# Patient Record
Sex: Female | Born: 1945 | Race: Black or African American | Hispanic: No | Marital: Married | Smoking: Former smoker
Health system: Southern US, Community
[De-identification: ages and names within clinical notes are randomized; demographics above are authoritative.]

## PROBLEM LIST (undated history)

## (undated) DIAGNOSIS — K449 Diaphragmatic hernia without obstruction or gangrene: Secondary | ICD-10-CM

## (undated) DIAGNOSIS — K5730 Diverticulosis of large intestine without perforation or abscess without bleeding: Secondary | ICD-10-CM

## (undated) DIAGNOSIS — N189 Chronic kidney disease, unspecified: Secondary | ICD-10-CM

## (undated) DIAGNOSIS — K649 Unspecified hemorrhoids: Secondary | ICD-10-CM

## (undated) DIAGNOSIS — K2270 Barrett's esophagus without dysplasia: Secondary | ICD-10-CM

## (undated) DIAGNOSIS — E785 Hyperlipidemia, unspecified: Secondary | ICD-10-CM

## (undated) DIAGNOSIS — G4730 Sleep apnea, unspecified: Secondary | ICD-10-CM

## (undated) DIAGNOSIS — D649 Anemia, unspecified: Secondary | ICD-10-CM

## (undated) DIAGNOSIS — K635 Polyp of colon: Secondary | ICD-10-CM

## (undated) DIAGNOSIS — I10 Essential (primary) hypertension: Secondary | ICD-10-CM

## (undated) HISTORY — DX: Diaphragmatic hernia without obstruction or gangrene: K44.9

## (undated) HISTORY — DX: Hyperlipidemia, unspecified: E78.5

## (undated) HISTORY — DX: Polyp of colon: K63.5

## (undated) HISTORY — PX: OTHER SURGICAL HISTORY: SHX169

## (undated) HISTORY — DX: Diverticulosis of large intestine without perforation or abscess without bleeding: K57.30

## (undated) HISTORY — DX: Barrett's esophagus without dysplasia: K22.70

## (undated) HISTORY — PX: ABDOMINAL HYSTERECTOMY: SHX81

## (undated) HISTORY — DX: Unspecified hemorrhoids: K64.9

---

## 2013-09-10 DIAGNOSIS — H31109 Choroidal degeneration, unspecified, unspecified eye: Secondary | ICD-10-CM | POA: Diagnosis not present

## 2013-09-10 DIAGNOSIS — H5200 Hypermetropia, unspecified eye: Secondary | ICD-10-CM | POA: Diagnosis not present

## 2013-09-10 DIAGNOSIS — H269 Unspecified cataract: Secondary | ICD-10-CM | POA: Diagnosis not present

## 2013-11-12 DIAGNOSIS — N179 Acute kidney failure, unspecified: Secondary | ICD-10-CM | POA: Diagnosis present

## 2013-11-12 DIAGNOSIS — I10 Essential (primary) hypertension: Secondary | ICD-10-CM | POA: Diagnosis not present

## 2013-11-12 DIAGNOSIS — K219 Gastro-esophageal reflux disease without esophagitis: Secondary | ICD-10-CM | POA: Diagnosis present

## 2013-11-12 DIAGNOSIS — R7309 Other abnormal glucose: Secondary | ICD-10-CM | POA: Diagnosis not present

## 2013-11-12 DIAGNOSIS — D649 Anemia, unspecified: Secondary | ICD-10-CM | POA: Diagnosis present

## 2013-11-12 DIAGNOSIS — K5732 Diverticulitis of large intestine without perforation or abscess without bleeding: Secondary | ICD-10-CM | POA: Diagnosis not present

## 2013-11-12 DIAGNOSIS — K7689 Other specified diseases of liver: Secondary | ICD-10-CM | POA: Diagnosis not present

## 2013-11-12 DIAGNOSIS — Z79899 Other long term (current) drug therapy: Secondary | ICD-10-CM | POA: Diagnosis not present

## 2013-11-27 DIAGNOSIS — K5732 Diverticulitis of large intestine without perforation or abscess without bleeding: Secondary | ICD-10-CM | POA: Diagnosis not present

## 2013-11-27 DIAGNOSIS — M549 Dorsalgia, unspecified: Secondary | ICD-10-CM | POA: Diagnosis not present

## 2013-11-27 DIAGNOSIS — I10 Essential (primary) hypertension: Secondary | ICD-10-CM | POA: Diagnosis not present

## 2013-11-27 DIAGNOSIS — R937 Abnormal findings on diagnostic imaging of other parts of musculoskeletal system: Secondary | ICD-10-CM | POA: Diagnosis not present

## 2013-11-27 DIAGNOSIS — M4310 Spondylolisthesis, site unspecified: Secondary | ICD-10-CM | POA: Diagnosis not present

## 2013-11-27 DIAGNOSIS — J309 Allergic rhinitis, unspecified: Secondary | ICD-10-CM | POA: Diagnosis not present

## 2013-11-27 DIAGNOSIS — Z79899 Other long term (current) drug therapy: Secondary | ICD-10-CM | POA: Diagnosis not present

## 2013-11-27 DIAGNOSIS — K219 Gastro-esophageal reflux disease without esophagitis: Secondary | ICD-10-CM | POA: Diagnosis not present

## 2013-12-11 DIAGNOSIS — K219 Gastro-esophageal reflux disease without esophagitis: Secondary | ICD-10-CM | POA: Diagnosis not present

## 2013-12-11 DIAGNOSIS — I10 Essential (primary) hypertension: Secondary | ICD-10-CM | POA: Diagnosis not present

## 2013-12-11 DIAGNOSIS — R7989 Other specified abnormal findings of blood chemistry: Secondary | ICD-10-CM | POA: Diagnosis not present

## 2013-12-11 DIAGNOSIS — D649 Anemia, unspecified: Secondary | ICD-10-CM | POA: Diagnosis not present

## 2014-03-13 DIAGNOSIS — J309 Allergic rhinitis, unspecified: Secondary | ICD-10-CM | POA: Diagnosis not present

## 2014-03-13 DIAGNOSIS — K219 Gastro-esophageal reflux disease without esophagitis: Secondary | ICD-10-CM | POA: Diagnosis not present

## 2014-03-13 DIAGNOSIS — I10 Essential (primary) hypertension: Secondary | ICD-10-CM | POA: Diagnosis not present

## 2019-03-08 ENCOUNTER — Other Ambulatory Visit: Payer: Self-pay

## 2019-03-08 ENCOUNTER — Encounter (HOSPITAL_COMMUNITY): Payer: Self-pay | Admitting: Emergency Medicine

## 2019-03-08 ENCOUNTER — Emergency Department (HOSPITAL_COMMUNITY)
Admission: EM | Admit: 2019-03-08 | Discharge: 2019-03-09 | Disposition: A | Discharge status: Home / Self Care | Payer: Medicare Other | Attending: Emergency Medicine | Admitting: Emergency Medicine

## 2019-03-08 DIAGNOSIS — I10 Essential (primary) hypertension: Secondary | ICD-10-CM | POA: Diagnosis not present

## 2019-03-08 HISTORY — DX: Essential (primary) hypertension: I10

## 2019-03-08 LAB — BASIC METABOLIC PANEL
Anion gap: 9 (ref 5–15)
BUN: 19 mg/dL (ref 8–23)
CO2: 24 mmol/L (ref 22–32)
Calcium: 9.6 mg/dL (ref 8.9–10.3)
Chloride: 104 mmol/L (ref 98–111)
Creatinine, Ser: 1.31 mg/dL — ABNORMAL HIGH (ref 0.44–1.00)
GFR calc Af Amer: 46 mL/min — ABNORMAL LOW (ref >60)
GFR calc non Af Amer: 40 mL/min — ABNORMAL LOW (ref >60)
Glucose, Bld: 103 mg/dL — ABNORMAL HIGH (ref 70–99)
Potassium: 4.3 mmol/L (ref 3.5–5.1)
Sodium: 137 mmol/L (ref 135–145)

## 2019-03-08 LAB — CBC
HCT: 48.8 % — ABNORMAL HIGH (ref 36.0–46.0)
Hemoglobin: 14.4 g/dL (ref 12.0–15.0)
MCH: 25.5 pg — ABNORMAL LOW (ref 26.0–34.0)
MCHC: 29.5 g/dL — ABNORMAL LOW (ref 30.0–36.0)
MCV: 86.4 fL (ref 80.0–100.0)
Platelets: 251 10*3/uL (ref 150–400)
RBC: 5.65 MIL/uL — ABNORMAL HIGH (ref 3.87–5.11)
RDW: 15.7 % — ABNORMAL HIGH (ref 11.5–15.5)
WBC: 4.7 10*3/uL (ref 4.0–10.5)
nRBC: 0 % (ref 0.0–0.2)

## 2019-03-08 NOTE — ED Notes (Signed)
[facility] called to check on pt, aware still in waiting

## 2019-03-08 NOTE — ED Triage Notes (Signed)
Pt in with c/o HTN, wants BP check. States she went to doc Tuesday, was started on Amlodipine and Atenolol. In triage, BP 227/117. Denies any HA or other symptoms. Did take meds today PTA

## 2019-03-09 ENCOUNTER — Other Ambulatory Visit: Payer: Self-pay

## 2019-03-09 LAB — URINALYSIS, ROUTINE W REFLEX MICROSCOPIC
Bilirubin Urine: NEGATIVE
Glucose, UA: NEGATIVE mg/dL
Hgb urine dipstick: NEGATIVE
Ketones, ur: NEGATIVE mg/dL
Leukocytes,Ua: NEGATIVE
Nitrite: NEGATIVE
Protein, ur: 100 mg/dL — AB
Specific Gravity, Urine: 1.004 — ABNORMAL LOW (ref 1.005–1.030)
pH: 5 (ref 5.0–8.0)

## 2019-03-09 NOTE — ED Provider Notes (Signed)
[facility] EMERGENCY DEPARTMENT Provider Note   CSN: [id number] Arrival date & time: 03/08/19  1526     History   Chief Complaint Chief Complaint  Patient presents with  . Hypertension    HPI Amanda Bauer is a 73 y.o. female.     Patient with a history of HTN, thyroid disease presents to the emergency department to have her blood pressure checked. She notes having been on medication for "a long time". Her medication was changed 3 months ago to Norvasc 5 mg, and having a second medication added 4 days ago after finding her pressure elevated on PCP exam. She presents today without any symptoms wanting to have her blood pressure checked. No chest pain, new SOB, nausea, fatigue, diaphoresis.   The history is provided by the patient. No language interpreter was used.  Hypertension Pertinent negatives include no chest pain and no shortness of breath.    Past Medical History:  Diagnosis Date  . Hypertension     There are no active problems to display for this patient.   History reviewed. No pertinent surgical history.   OB History   No obstetric history on file.      Home Medications    Prior to Admission medications   Not on File    Family History No family history on file.  Social History Social History   Tobacco Use  . Smoking status: Never Smoker  . Smokeless tobacco: Never Used  Substance Use Topics  . Alcohol use: Yes    Comment: occasional  . Drug use: Not on file     Allergies   Patient has no allergy information on record.   Review of Systems Review of Systems  Constitutional: Negative for chills, diaphoresis and fever.  HENT: Negative.   Respiratory: Negative.  Negative for shortness of breath.   Cardiovascular: Negative.  Negative for chest pain.  Gastrointestinal: Negative.  Negative for nausea.  Musculoskeletal: Negative.   Skin: Negative.   Neurological: Negative.  Negative for weakness.     Physical Exam  Updated Vital Signs BP (!) 176/144 Comment: Simultaneous filing. User may not have seen previous data.  Pulse 62   Temp 97.9 F (36.6 C) (Oral)   Resp 13   Wt 104.3 kg   SpO2 93%   Physical Exam Vitals signs and nursing note reviewed.  Constitutional:      Appearance: She is well-developed.  HENT:     Head: Normocephalic.  Neck:     Musculoskeletal: Normal range of motion and neck supple.     Vascular: No carotid bruit.  Cardiovascular:     Rate and Rhythm: Normal rate and regular rhythm.  Pulmonary:     Effort: Pulmonary effort is normal.     Breath sounds: No wheezing, rhonchi or rales.  Abdominal:     General: Bowel sounds are normal.     Palpations: Abdomen is soft.     Tenderness: There is no abdominal tenderness. There is no guarding or rebound.  Musculoskeletal: Normal range of motion.     Right lower leg: No edema.     Left lower leg: No edema.  Skin:    General: Skin is warm and dry.     Findings: No rash.  Neurological:     General: No focal deficit present.     Mental Status: She is alert and oriented to person, place, and time.      ED Treatments / Results  Labs (all labs ordered are  listed, but only abnormal results are displayed) Labs Reviewed  BASIC METABOLIC PANEL - Abnormal; Notable for the following components:      Result Value   Glucose, Bld 103 (*)    Creatinine, Ser 1.31 (*)    GFR calc non Af Amer 40 (*)    GFR calc Af Amer 46 (*)    All other components within normal limits  CBC - Abnormal; Notable for the following components:   RBC 5.65 (*)    HCT 48.8 (*)    MCH 25.5 (*)    MCHC 29.5 (*)    RDW 15.7 (*)    All other components within normal limits  URINALYSIS, ROUTINE W REFLEX MICROSCOPIC    EKG EKG Interpretation  Date/Time:  Thursday March 08 2019 16:21:52 EDT Ventricular Rate:  64 PR Interval:  168 QRS Duration: 84 QT Interval:  442 QTC Calculation: 455 R Axis:   36 Text Interpretation:  Normal sinus rhythm with  sinus arrhythmia Minimal voltage criteria for LVH, may be normal variant ( Cornell product ) Borderline ECG No previous ECGs available Confirmed by Zadie Rhine ([id number]) on 03/09/2019 4:07:17 AM   Radiology No results found.  Procedures Procedures (including critical care time)  Medications Ordered in ED Medications - No data to display   Initial Impression / Assessment and Plan / ED Course  I have reviewed the triage vital signs and the nursing notes.  Pertinent labs & imaging results that were available during my care of the patient were reviewed by me and considered in my medical decision making (see chart for details).        Patient to ED for blood pressure check. She denies any concerning symptoms.   Blood pressure is elevated on multiple checks tonight. Manual pressure is 189/89.   Discussed with Dr. Bebe Shaggy who has seen the patient. Feel she can be discharged home to continue current medications, instructed to keep a log of once daily BP and follow up with PCP in one week. May need an increase to her medications but no change indicated tonight given she just started Atenolol 4 days ago and she is asymptomatic.     Final Clinical Impressions(s) / ED Diagnoses   Final diagnoses:  None   1. Hypertension   ED Discharge Orders    None       Elpidio Anis, Cordelia Poche 03/09/19 Joselyn Glassman, MD 03/09/19 [phone]

## 2019-03-09 NOTE — Discharge Instructions (Signed)
Continue taking your blood pressure medications as prescribed. Measure your blood pressure once daily and keep a journal of those measurements. Follow up with your doctor in one week for recheck and review of your journal to determine if another change to your medication is needed.   Return to the emergency department with any chest pain, significant shortness of breath, severe headache, weakness or new concern.

## 2019-03-09 NOTE — ED Notes (Signed)
Patient verbalizes understanding of discharge instructions. Opportunity for questioning and answers were provided. Armband removed by staff, pt discharged from ED.  

## 2019-05-24 DIAGNOSIS — R928 Other abnormal and inconclusive findings on diagnostic imaging of breast: Secondary | ICD-10-CM | POA: Diagnosis not present

## 2019-05-24 DIAGNOSIS — N6489 Other specified disorders of breast: Secondary | ICD-10-CM | POA: Diagnosis not present

## 2020-01-17 DIAGNOSIS — R0683 Snoring: Secondary | ICD-10-CM | POA: Diagnosis not present

## 2020-01-17 DIAGNOSIS — I10 Essential (primary) hypertension: Secondary | ICD-10-CM | POA: Diagnosis not present

## 2020-01-17 DIAGNOSIS — E785 Hyperlipidemia, unspecified: Secondary | ICD-10-CM | POA: Diagnosis not present

## 2020-01-17 DIAGNOSIS — R5383 Other fatigue: Secondary | ICD-10-CM | POA: Diagnosis not present

## 2020-01-17 DIAGNOSIS — Z79899 Other long term (current) drug therapy: Secondary | ICD-10-CM | POA: Diagnosis not present

## 2020-01-17 DIAGNOSIS — E039 Hypothyroidism, unspecified: Secondary | ICD-10-CM | POA: Diagnosis not present

## 2020-01-17 DIAGNOSIS — E1169 Type 2 diabetes mellitus with other specified complication: Secondary | ICD-10-CM | POA: Diagnosis not present

## 2020-01-17 DIAGNOSIS — R0602 Shortness of breath: Secondary | ICD-10-CM | POA: Diagnosis not present

## 2020-01-28 DIAGNOSIS — R809 Proteinuria, unspecified: Secondary | ICD-10-CM | POA: Diagnosis not present

## 2020-02-13 DIAGNOSIS — I10 Essential (primary) hypertension: Secondary | ICD-10-CM | POA: Diagnosis not present

## 2020-02-19 DIAGNOSIS — I10 Essential (primary) hypertension: Secondary | ICD-10-CM | POA: Diagnosis not present

## 2020-02-19 DIAGNOSIS — I340 Nonrheumatic mitral (valve) insufficiency: Secondary | ICD-10-CM | POA: Diagnosis not present

## 2020-02-20 ENCOUNTER — Encounter: Payer: Self-pay | Admitting: Cardiology

## 2020-02-20 DIAGNOSIS — E039 Hypothyroidism, unspecified: Secondary | ICD-10-CM

## 2020-02-20 DIAGNOSIS — I10 Essential (primary) hypertension: Secondary | ICD-10-CM | POA: Insufficient documentation

## 2020-02-20 DIAGNOSIS — K2270 Barrett's esophagus without dysplasia: Secondary | ICD-10-CM | POA: Insufficient documentation

## 2020-02-20 DIAGNOSIS — K5730 Diverticulosis of large intestine without perforation or abscess without bleeding: Secondary | ICD-10-CM

## 2020-02-20 DIAGNOSIS — K635 Polyp of colon: Secondary | ICD-10-CM | POA: Insufficient documentation

## 2020-02-20 DIAGNOSIS — K449 Diaphragmatic hernia without obstruction or gangrene: Secondary | ICD-10-CM | POA: Insufficient documentation

## 2020-02-20 DIAGNOSIS — M1990 Unspecified osteoarthritis, unspecified site: Secondary | ICD-10-CM

## 2020-02-20 DIAGNOSIS — E785 Hyperlipidemia, unspecified: Secondary | ICD-10-CM | POA: Insufficient documentation

## 2020-02-20 DIAGNOSIS — K649 Unspecified hemorrhoids: Secondary | ICD-10-CM | POA: Insufficient documentation

## 2020-02-20 HISTORY — DX: Hypothyroidism, unspecified: E03.9

## 2020-02-20 HISTORY — DX: Unspecified osteoarthritis, unspecified site: M19.90

## 2020-02-26 ENCOUNTER — Encounter: Payer: Self-pay | Admitting: Pulmonary Disease

## 2020-02-26 ENCOUNTER — Ambulatory Visit: Payer: Medicare Other | Admitting: Pulmonary Disease

## 2020-02-26 ENCOUNTER — Other Ambulatory Visit: Payer: Self-pay

## 2020-02-26 VITALS — BP 124/82 | HR 43 | Temp 97.2°F | Ht 66.0 in | Wt 228.6 lb

## 2020-02-26 DIAGNOSIS — G4733 Obstructive sleep apnea (adult) (pediatric): Secondary | ICD-10-CM

## 2020-02-26 NOTE — Progress Notes (Signed)
Amanda Bauer    [id number]    [birth year]  Primary Care Physician:Prochnau, Rayfield Citizen, MD  Referring Physician: Philemon Kingdom, MD [address] [place],  [place] [address]  Chief complaint:    Patient with a history of gasping respiration, wakes up tired  HPI:  History of snoring Wake up tired Gasping respirations at night  Usually goes to bed between 10 and 11 Might take up to 2 hours to fall asleep sometimes About 3 awakenings Final wake up time between 10 AM and 11 AM  Has a son who has obstructive sleep apnea  About 20-30 pound weight gain recently -Decreased activity levels  Admits to occasional dryness of the mouth in the morning No morning headaches No night sweats Memory is good Has no difficulty driving for long hours   Outpatient Encounter Medications as of 02/26/2020  Medication Sig  . atenolol (TENORMIN) 50 MG tablet Take 50 mg by mouth daily.  . cloNIDine (CATAPRES) 0.1 MG tablet Take 0.1 mg by mouth daily as needed.  Marland Kitchen levothyroxine (SYNTHROID) 112 MCG tablet Take 112 mcg by mouth daily before breakfast.  . valsartan (DIOVAN) 320 MG tablet    No facility-administered encounter medications on file as of 02/26/2020.    Allergies as of 02/26/2020 - Review Complete 02/26/2020  Allergen Reaction Noted  . Aspirin Other (See Comments) 02/20/2020  . Pravastatin Nausea Only 02/20/2020  . Tramadol Other (See Comments) 02/20/2020    Past Medical History:  Diagnosis Date  . Barrett esophagus   . Colon polyps   . Diverticula of colon    Left colon  . Hemorrhoids    internal   . Hiatal hernia   . Hyperlipidemia   . Hypertension   . Hypothyroidism 02/20/2020  . Osteoarthritis 02/20/2020    Past Surgical History:  Procedure Laterality Date  . complete hysterectomy      Family History  Problem Relation Age of Onset  . Hypertension Mother   . Stomach cancer Mother   . Stroke Father   . Cerebral aneurysm Other   . Cancer Other        GI  Tract    Social History   Socioeconomic History  . Marital status: Married    Spouse name: Not on file  . Number of children: Not on file  . Years of education: Not on file  . Highest education level: Not on file  Occupational History  . Not on file  Tobacco Use  . Smoking status: Never Smoker  . Smokeless tobacco: Never Used  Substance and Sexual Activity  . Alcohol use: Yes    Comment: occasional  . Drug use: Not on file  . Sexual activity: Not on file  Other Topics Concern  . Not on file  Social History Narrative  . Not on file   Social Determinants of Health   Financial Resource Strain:   . Difficulty of Paying Living Expenses: Not on file  Food Insecurity:   . Worried About Programme researcher, broadcasting/film/video in the Last Year: Not on file  . Ran Out of Food in the Last Year: Not on file  Transportation Needs:   . Lack of Transportation (Medical): Not on file  . Lack of Transportation (Non-Medical): Not on file  Physical Activity:   . Days of Exercise per Week: Not on file  . Minutes of Exercise per Session: Not on file  Stress:   . Feeling of Stress : Not on  file  Social Connections:   . Frequency of Communication with Friends and Family: Not on file  . Frequency of Social Gatherings with Friends and Family: Not on file  . Attends Religious Services: Not on file  . Active Member of Clubs or Organizations: Not on file  . Attends Banker Meetings: Not on file  . Marital Status: Not on file  Intimate Partner Violence:   . Fear of Current or Ex-Partner: Not on file  . Emotionally Abused: Not on file  . Physically Abused: Not on file  . Sexually Abused: Not on file    Review of Systems  Constitutional: Negative.   Psychiatric/Behavioral: Positive for sleep disturbance.    Vitals:   02/26/20 0940  BP: 124/82  Pulse: (!) 43  Temp: (!) 97.2 F (36.2 C)  SpO2: 98%     Physical Exam Constitutional:      Appearance: She is obese.  HENT:     Head:  Normocephalic and atraumatic.     Mouth/Throat:     Mouth: Mucous membranes are moist.     Pharynx: No oropharyngeal exudate or posterior oropharyngeal erythema.  Eyes:     General:        Right eye: No discharge.        Left eye: No discharge.  Cardiovascular:     Rate and Rhythm: Normal rate and regular rhythm.     Pulses: Normal pulses.     Heart sounds: Normal heart sounds. No murmur heard.  No friction rub.  Pulmonary:     Effort: Pulmonary effort is normal. No respiratory distress.     Breath sounds: No stridor. No wheezing or rhonchi.  Musculoskeletal:     Cervical back: No rigidity or tenderness.  Neurological:     Mental Status: She is alert.  Psychiatric:        Mood and Affect: Mood normal.    Results of the Epworth flowsheet 02/26/2020  Sitting and reading 0  Watching TV 1  Sitting, inactive in a public place (e.g. a theatre or a meeting) 0  As a passenger in a car for an hour without a break 0  Lying down to rest in the afternoon when circumstances permit 0  Sitting and talking to someone 0  Sitting quietly after a lunch without alcohol 1  In a car, while stopped for a few minutes in traffic 0  Total score 2   Assessment:  Moderate probability of significant obstructive sleep apnea  Obesity  Nonrestorative sleep  Pathophysiology of sleep disordered breathing discussed with patient Treatment options for sleep disordered breathing discussed with the patient  Diagnostic options discussed, patient does not feel she will be able to set up a home sleep study  Plan/Recommendations: We will schedule the patient for an in lab polysomnogram-split-night study will be ordered  Risks with not treating sleep disordered breathing discussed  Weight loss efforts encouraged  I will see her back in about 3 months  Encouraged to call with any significant concerns  Virl Diamond MD [facility] 02/26/2020, 10:05 AM  CC: Philemon Kingdom, MD

## 2020-02-26 NOTE — Progress Notes (Signed)
Cardiology Office Note:    Date:  02/27/2020   ID:  Amanda Bauer, DOB [birth year], MRN [id number]  PCP:  Philemon Kingdom, MD  Cardiologist:  Norman Herrlich, MD   Referring MD: Philemon Kingdom, MD  ASSESSMENT:    1. Hypertensive heart disease with heart failure (HCC)   2. Stage 3b chronic kidney disease (HCC)   3. Nonrheumatic aortic valve insufficiency   4. Palpitations    PLAN:    In order of problems listed above:  1. She has longstanding poorly controlled hypertension with endorgan damage.  Diastolic function is variable and I suspect that she had an element of diastolic heart failure with poorly controlled hypertension.  She is improved she is not fluid overloaded at this time I would not put her on a loop diuretic.  The goal here is to control her hypertension.  On when a switch her from a atenolol to carvedilol continue valsartan allow her nephrologist to make a decision about diuretic either loop or MRA with her CKD.  Another good choice for her would be a calcium channel blocker African-American patient.  I do not think she needs an ischemia evaluation. 2. To be seen by nephrology tomorrow with proteinuria she is at higher risk and I suspect she has hypertensive nephropathy her kidney function may transiently decrease but will improve and stabilize long-term with hypertension control especially with ARB. 3. Valvular heart disease is minor no murmur on exam clinically insignificant 4. At risk for arrhythmia especially atrial fibrillation we will plan a 1 week ZIO monitor if abnormal will bring her back to my office.  Next appointment I will see back in the office as needed as she will be seen by nephrology for hypertension and proteinuria and has a good relationship with her primary care physician.   Medication Adjustments/Labs and Tests Ordered: Current medicines are reviewed at length with the patient today.  Concerns regarding medicines are outlined above.  Orders Placed  This Encounter  Procedures  . LONG TERM MONITOR (3-14 DAYS)  . EKG 12-Lead   No orders of the defined types were placed in this encounter.    Chief Complaint  Patient presents with  . Shortness of Breath  . Hypertension  . Palpitations    History of Present Illness:    Amanda Bauer is a 74 y.o. female who is being seen today for the evaluation of abnormal EKG at the request of Prochnau, Rayfield Citizen, MD.  She has a long history of hypertension dating back likely 15 years pregnancy and she tells me that in general its not been well controlled.  She was recently started on ACE inhibitor.  She developed shortness of breath with activities and was seen by primary care physician 01/17/2020 her EKG showed findings of left ventricular hypertrophy and she was referred for an echocardiogram done at [facility].  Her ejection fraction was low normal she had mild left ventricular hypertrophy and she had aortic valve sclerosis and mild aortic regurgitation.  Lab work performed showed a normal CBC significant CKD with a GFR 34 cc creatinine 1.49 sodium and potassium were normal 144 and 5.0.  BNP assay was mid range at 433 not normal but not particularly elevated to heart failure range.  Her urinalysis showed 3+ protein.  Her lipid profile with this favorable with an LDL of 158 and 9 non-HDL cholesterol 190 TSH was normal A1c was normal.  She had prominent snoring and was referred to pulmonary and is scheduled for sleep study.  Her antihypertensive agents were adjusted her blood pressure remains elevated she told me at times greater than 200 systolic and even sitting and resting still has systolics in the range of 170 or greater.  Fortunately her shortness of breath and fatigue have resolved.  During that timeframe she had no edema orthopnea no chest pain but was aware of her heart beating at times it felt irregular.  The symptoms have resolved by the time the EKG was performed.  She has no known history of  murmur congenital rheumatic heart disease or atrial fibrillation.  Past Medical History:  Diagnosis Date  . Barrett esophagus   . Colon polyps   . Diverticula of colon    Left colon  . Hemorrhoids    internal   . Hiatal hernia   . Hyperlipidemia   . Hypertension   . Hypothyroidism 02/20/2020  . Osteoarthritis 02/20/2020    Past Surgical History:  Procedure Laterality Date  . complete hysterectomy      Current Medications: Current Meds  Medication Sig  . atenolol (TENORMIN) 50 MG tablet Take 50 mg by mouth daily.  . cloNIDine (CATAPRES) 0.1 MG tablet Take 0.1 mg by mouth daily as needed.  Marland Kitchen levothyroxine (SYNTHROID) 112 MCG tablet Take 112 mcg by mouth daily before breakfast.  . valsartan (DIOVAN) 320 MG tablet Take 320 mg by mouth daily.      Allergies:   Aspirin, Pravastatin, and Tramadol   Social History   Socioeconomic History  . Marital status: Married    Spouse name: Not on file  . Number of children: Not on file  . Years of education: Not on file  . Highest education level: Not on file  Occupational History  . Not on file  Tobacco Use  . Smoking status: Former Games developer  . Smokeless tobacco: Never Used  Vaping Use  . Vaping Use: Never used  Substance and Sexual Activity  . Alcohol use: Yes    Comment: occasional  . Drug use: Never  . Sexual activity: Not on file  Other Topics Concern  . Not on file  Social History Narrative  . Not on file   Social Determinants of Health   Financial Resource Strain:   . Difficulty of Paying Living Expenses: Not on file  Food Insecurity:   . Worried About Programme researcher, broadcasting/film/video in the Last Year: Not on file  . Ran Out of Food in the Last Year: Not on file  Transportation Needs:   . Lack of Transportation (Medical): Not on file  . Lack of Transportation (Non-Medical): Not on file  Physical Activity:   . Days of Exercise per Week: Not on file  . Minutes of Exercise per Session: Not on file  Stress:   . Feeling of  Stress : Not on file  Social Connections:   . Frequency of Communication with Friends and Family: Not on file  . Frequency of Social Gatherings with Friends and Family: Not on file  . Attends Religious Services: Not on file  . Active Member of Clubs or Organizations: Not on file  . Attends Banker Meetings: Not on file  . Marital Status: Not on file     Family History: The patient's family history includes Cancer in an other family member; Cerebral aneurysm in an other family member; Hypertension in her mother; Stomach cancer in her mother; Stroke in her father.  ROS:   ROS Please see the history of present illness.     All  other systems reviewed and are negative.  EKGs/Labs/Other Studies Reviewed:    The following studies were reviewed today:   EKG:  EKG is  ordered today.  The ekg ordered today is personally reviewed and demonstrates sinus bradycardia 58 bpm LVH voltage.  Recent Labs: 03/08/2019: BUN 19; Creatinine, Ser 1.31; Hemoglobin 14.4; Platelets 251; Potassium 4.3; Sodium 137  Recent Lipid Panel No results found for: CHOL, TRIG, HDL, CHOLHDL, VLDL, LDLCALC, LDLDIRECT  Physical Exam:    VS:  BP (!) 186/80   Pulse (!) 58   Ht 5\' 6"  (1.676 m)   Wt 232 lb 12.8 oz (105.6 kg)   SpO2 93%   BMI 37.57 kg/m     Wt Readings from Last 3 Encounters:  02/27/20 232 lb 12.8 oz (105.6 kg)  02/26/20 228 lb 9.6 oz (103.7 kg)  01/17/20 234 lb (106.1 kg)     GEN: Overweight BMI exceeds 35 well nourished, well developed in no acute distress HEENT: Normal NECK: No JVD; No carotid bruits LYMPHATICS: No lymphadenopathy CARDIAC: RRR, no murmurs, rubs, gallops RESPIRATORY:  Clear to auscultation without rales, wheezing or rhonchi  ABDOMEN: Soft, non-tender, non-distended MUSCULOSKELETAL:  No edema; No deformity  SKIN: Warm and dry NEUROLOGIC:  Alert and oriented x 3 PSYCHIATRIC:  Normal affect     Signed, 03/18/20, MD  02/27/2020 4:29 PM    [facility]

## 2020-02-26 NOTE — Patient Instructions (Signed)
Moderate probability of significant obstructive sleep apnea  We will schedule you for an in lab study  Update you with results  Treatment options as we discussed  Follow-up in 3 months Sleep Apnea Sleep apnea affects breathing during sleep. It causes breathing to stop for a short time or to become shallow. It can also increase the risk of:  Heart attack.  Stroke.  Being very overweight (obese).  Diabetes.  Heart failure.  Irregular heartbeat. The goal of treatment is to help you breathe normally again. What are the causes? There are three kinds of sleep apnea:  Obstructive sleep apnea. This is caused by a blocked or collapsed airway.  Central sleep apnea. This happens when the brain does not send the right signals to the muscles that control breathing.  Mixed sleep apnea. This is a combination of obstructive and central sleep apnea. The most common cause of this condition is a collapsed or blocked airway. This can happen if:  Your throat muscles are too relaxed.  Your tongue and tonsils are too large.  You are overweight.  Your airway is too small. What increases the risk?  Being overweight.  Smoking.  Having a small airway.  Being older.  Being female.  Drinking alcohol.  Taking medicines to calm yourself (sedatives or tranquilizers).  Having family members with the condition. What are the signs or symptoms?  Trouble staying asleep.  Being sleepy or tired during the day.  Getting angry a lot.  Loud snoring.  Headaches in the morning.  Not being able to focus your mind (concentrate).  Forgetting things.  Less interest in sex.  Mood swings.  Personality changes.  Feelings of sadness (depression).  Waking up a lot during the night to pee (urinate).  Dry mouth.  Sore throat. How is this diagnosed?  Your medical history.  A physical exam.  A test that is done when you are sleeping (sleep study). The test is most often done in a  sleep lab but may also be done at home. How is this treated?   Sleeping on your side.  Using a medicine to get rid of mucus in your nose (decongestant).  Avoiding the use of alcohol, medicines to help you relax, or certain pain medicines (narcotics).  Losing weight, if needed.  Changing your diet.  Not smoking.  Using a machine to open your airway while you sleep, such as: ? An oral appliance. This is a mouthpiece that shifts your lower jaw forward. ? A CPAP device. This device blows air through a mask when you breathe out (exhale). ? An EPAP device. This has valves that you put in each nostril. ? A BPAP device. This device blows air through a mask when you breathe in (inhale) and breathe out.  Having surgery if other treatments do not work. It is important to get treatment for sleep apnea. Without treatment, it can lead to:  High blood pressure.  Coronary artery disease.  In men, not being able to have an erection (impotence).  Reduced thinking ability. Follow these instructions at home: Lifestyle  Make changes that your doctor recommends.  Eat a healthy diet.  Lose weight if needed.  Avoid alcohol, medicines to help you relax, and some pain medicines.  Do not use any products that contain nicotine or tobacco, such as cigarettes, e-cigarettes, and chewing tobacco. If you need help quitting, ask your doctor. General instructions  Take over-the-counter and prescription medicines only as told by your doctor.  If you were  given a machine to use while you sleep, use it only as told by your doctor.  If you are having surgery, make sure to tell your doctor you have sleep apnea. You may need to bring your device with you.  Keep all follow-up visits as told by your doctor. This is important. Contact a doctor if:  The machine that you were given to use during sleep bothers you or does not seem to be working.  You do not get better.  You get worse. Get help right  away if:  Your chest hurts.  You have trouble breathing in enough air.  You have an uncomfortable feeling in your back, arms, or stomach.  You have trouble talking.  One side of your body feels weak.  A part of your face is hanging down. These symptoms may be an emergency. Do not wait to see if the symptoms will go away. Get medical help right away. Call your local emergency services (911 in the [place] Do not drive yourself to the hospital. Summary  This condition affects breathing during sleep.  The most common cause is a collapsed or blocked airway.  The goal of treatment is to help you breathe normally while you sleep. This information is not intended to replace advice given to you by your health care provider. Make sure you discuss any questions you have with your health care provider. Document Revised: 02/17/2018 Document Reviewed: 12/27/2017 Elsevier Patient Education  2020 [employer].

## 2020-02-27 ENCOUNTER — Encounter: Payer: Self-pay | Admitting: Cardiology

## 2020-02-27 ENCOUNTER — Ambulatory Visit (INDEPENDENT_AMBULATORY_CARE_PROVIDER_SITE_OTHER): Payer: Medicare PPO

## 2020-02-27 ENCOUNTER — Ambulatory Visit: Payer: Medicare PPO | Admitting: Cardiology

## 2020-02-27 VITALS — BP 186/80 | HR 58 | Ht 66.0 in | Wt 232.8 lb

## 2020-02-27 DIAGNOSIS — I351 Nonrheumatic aortic (valve) insufficiency: Secondary | ICD-10-CM | POA: Insufficient documentation

## 2020-02-27 DIAGNOSIS — I110 Hypertensive heart disease with heart failure: Secondary | ICD-10-CM

## 2020-02-27 DIAGNOSIS — I10 Essential (primary) hypertension: Secondary | ICD-10-CM | POA: Diagnosis not present

## 2020-02-27 DIAGNOSIS — N1832 Chronic kidney disease, stage 3b: Secondary | ICD-10-CM

## 2020-02-27 DIAGNOSIS — R002 Palpitations: Secondary | ICD-10-CM

## 2020-02-27 DIAGNOSIS — N183 Chronic kidney disease, stage 3 unspecified: Secondary | ICD-10-CM | POA: Insufficient documentation

## 2020-02-27 DIAGNOSIS — Z79899 Other long term (current) drug therapy: Secondary | ICD-10-CM | POA: Diagnosis not present

## 2020-02-27 MED ORDER — CARVEDILOL 12.5 MG PO TABS: 12.5000 mg | ORAL_TABLET | Freq: Two times a day (BID) | ORAL | 3 refills | Status: DC

## 2020-02-27 NOTE — Patient Instructions (Addendum)
Medication Instructions:  Your physician has recommended you make the following change in your medication:  STOP: Atenolol START: Carvedilol 12.5 mg take one tablet by mouth twice daily.   *If you need a refill on your cardiac medications before your next appointment, please call your pharmacy*   Lab Work: None If you have labs (blood work) drawn today and your tests are completely normal, you will receive your results only by: Marland Kitchen MyChart Message (if you have MyChart) OR . A paper copy in the mail If you have any lab test that is abnormal or we need to change your treatment, we will call you to review the results.   Testing/Procedures: A zio monitor was ordered today. It will remain on for 7 days. You will then return monitor and event diary in provided box. It takes 1-2 weeks for report to be downloaded and returned to [place]. We will call you with the results. If monitor falls off or has orange flashing light, please call [employer] for further instructions.      Follow-Up: At [facility], you and your health needs are our priority.  As part of our continuing mission to provide you with exceptional heart care, we have created designated Provider Care Teams.  These Care Teams include your primary Cardiologist (physician) and Advanced Practice Providers (APPs -  Physician Assistants and Nurse Practitioners) who all work together to provide you with the care you need, when you need it.  We recommend signing up for the patient portal called "MyChart".  Sign up information is provided on this After Visit Summary.  MyChart is used to connect with patients for Virtual Visits (Telemedicine).  Patients are able to view lab/test results, encounter notes, upcoming appointments, etc.  Non-urgent messages can be sent to your provider as well.   To learn more about what you can do with MyChart, go to [email or web address].    Your next appointment:   As needed  The format for your next appointment:   In  Person  Provider:   Norman Herrlich, MD   Other Instructions

## 2020-02-28 DIAGNOSIS — I129 Hypertensive chronic kidney disease with stage 1 through stage 4 chronic kidney disease, or unspecified chronic kidney disease: Secondary | ICD-10-CM | POA: Diagnosis not present

## 2020-02-28 DIAGNOSIS — D631 Anemia in chronic kidney disease: Secondary | ICD-10-CM | POA: Diagnosis not present

## 2020-02-28 DIAGNOSIS — R7303 Prediabetes: Secondary | ICD-10-CM | POA: Diagnosis not present

## 2020-02-28 DIAGNOSIS — N189 Chronic kidney disease, unspecified: Secondary | ICD-10-CM | POA: Diagnosis not present

## 2020-02-28 DIAGNOSIS — N1832 Chronic kidney disease, stage 3b: Secondary | ICD-10-CM | POA: Diagnosis not present

## 2020-03-03 DIAGNOSIS — E875 Hyperkalemia: Secondary | ICD-10-CM | POA: Diagnosis not present

## 2020-03-10 ENCOUNTER — Other Ambulatory Visit: Payer: Self-pay | Admitting: Nephrology

## 2020-03-10 DIAGNOSIS — N1832 Chronic kidney disease, stage 3b: Secondary | ICD-10-CM

## 2020-03-10 DIAGNOSIS — R7303 Prediabetes: Secondary | ICD-10-CM

## 2020-03-11 ENCOUNTER — Ambulatory Visit
Admission: RE | Admit: 2020-03-11 | Discharge: 2020-03-11 | Disposition: A | Discharge status: Home / Self Care | Payer: Medicare PPO | Source: Ambulatory Visit | Attending: Nephrology | Admitting: Nephrology

## 2020-03-11 DIAGNOSIS — R7303 Prediabetes: Secondary | ICD-10-CM

## 2020-03-11 DIAGNOSIS — K7689 Other specified diseases of liver: Secondary | ICD-10-CM | POA: Diagnosis not present

## 2020-03-11 DIAGNOSIS — N189 Chronic kidney disease, unspecified: Secondary | ICD-10-CM | POA: Diagnosis not present

## 2020-03-11 DIAGNOSIS — N1832 Chronic kidney disease, stage 3b: Secondary | ICD-10-CM

## 2020-03-12 DIAGNOSIS — R002 Palpitations: Secondary | ICD-10-CM | POA: Diagnosis not present

## 2020-03-12 DIAGNOSIS — Z79899 Other long term (current) drug therapy: Secondary | ICD-10-CM | POA: Diagnosis not present

## 2020-03-13 ENCOUNTER — Other Ambulatory Visit: Payer: Self-pay | Admitting: Pulmonary Disease

## 2020-03-13 ENCOUNTER — Telehealth: Payer: Self-pay

## 2020-03-13 ENCOUNTER — Telehealth: Payer: Self-pay | Admitting: Pulmonary Disease

## 2020-03-13 DIAGNOSIS — G4733 Obstructive sleep apnea (adult) (pediatric): Secondary | ICD-10-CM

## 2020-03-13 NOTE — Telephone Encounter (Signed)
-----   Message from Tomma Lightning, MD sent at 03/13/2020 11:53 AM EDT ----- Patient had stated that she may not be able to set up the study well is why I ordered an in lab  We can schedule her for a home sleep study but if we are not able to confirm that she can set it up well, unfortunately, will be a falsely negative study  We can let the patient know and if she is agreeable then we can go ahead and set up a home sleep study  Wale  ----- Message ----- From: Ave Filter, CMA Sent: 03/13/2020  11:31 AM EDT To: Tomma Lightning, MD  Pt's ins has denied the in lab sleep study they want pt to do a home study if you agree I will need an order thanks libby

## 2020-03-13 NOTE — Telephone Encounter (Signed)
That works.  Can we make sure we check on her in a week in case we don't hear back from her

## 2020-03-13 NOTE — Telephone Encounter (Addendum)
Pt wants to discuss with daughther 1 st before proceeding with home sleep study.pt has agreed to proceed with home sleep study

## 2020-03-13 NOTE — Telephone Encounter (Signed)
There has been a HST & a split night study ordered for this patient.  Which one should be scheduled? Marland Kitchen

## 2020-03-14 ENCOUNTER — Telehealth: Payer: Self-pay | Admitting: Pulmonary Disease

## 2020-03-17 ENCOUNTER — Telehealth: Payer: Self-pay

## 2020-03-17 NOTE — Telephone Encounter (Signed)
Spoke to daughter Carollee Herter pt will try HST and daughter will come with her for the instructions to help mother with study that night Tobe Sos

## 2020-03-17 NOTE — Telephone Encounter (Signed)
Spoke to [email or web address]  pt will have to try HST 1st

## 2020-03-17 NOTE — Telephone Encounter (Signed)
Tried calling patient. No answer and no voicemail set up for me to leave a message. 

## 2020-03-17 NOTE — Telephone Encounter (Signed)
Called pt no answer and no voice mail set up Amanda Bauer

## 2020-03-17 NOTE — Telephone Encounter (Signed)
-----   Message from Baldo Daub, MD sent at 03/17/2020  2:57 PM EDT ----- She has occasional atrial premature contractions but there were no episodes of atrial fibrillation or flutter overall a good report

## 2020-03-18 ENCOUNTER — Telehealth: Payer: Self-pay

## 2020-03-18 NOTE — Telephone Encounter (Signed)
-----   Message from Brian J Munley, MD sent at 03/17/2020  2:57 PM EDT ----- She has occasional atrial premature contractions but there were no episodes of atrial fibrillation or flutter overall a good report 

## 2020-03-18 NOTE — Telephone Encounter (Signed)
Tried calling patient. No answer and no voicemail set up for me to leave a message. 

## 2020-03-19 NOTE — Telephone Encounter (Signed)
I am acting in triage role today to assist with patient callbacks. Result relayed to patient. The patient verbalized understanding and gratitude.

## 2020-03-28 DIAGNOSIS — R7303 Prediabetes: Secondary | ICD-10-CM | POA: Diagnosis not present

## 2020-03-28 DIAGNOSIS — N1832 Chronic kidney disease, stage 3b: Secondary | ICD-10-CM | POA: Diagnosis not present

## 2020-03-28 DIAGNOSIS — I129 Hypertensive chronic kidney disease with stage 1 through stage 4 chronic kidney disease, or unspecified chronic kidney disease: Secondary | ICD-10-CM | POA: Diagnosis not present

## 2020-03-28 DIAGNOSIS — E785 Hyperlipidemia, unspecified: Secondary | ICD-10-CM | POA: Diagnosis not present

## 2020-04-02 ENCOUNTER — Encounter (HOSPITAL_BASED_OUTPATIENT_CLINIC_OR_DEPARTMENT_OTHER): Payer: Medicare PPO | Admitting: Internal Medicine

## 2020-04-05 ENCOUNTER — Encounter (HOSPITAL_COMMUNITY): Payer: Self-pay | Admitting: Emergency Medicine

## 2020-04-05 ENCOUNTER — Emergency Department (HOSPITAL_COMMUNITY): Payer: Medicare PPO

## 2020-04-05 ENCOUNTER — Other Ambulatory Visit: Payer: Self-pay

## 2020-04-05 ENCOUNTER — Inpatient Hospital Stay (HOSPITAL_COMMUNITY)
Admission: EM | Admit: 2020-04-05 | Discharge: 2020-04-12 | DRG: 291 | Disposition: A | Discharge status: Home / Self Care | Payer: Medicare PPO | Attending: Internal Medicine | Admitting: Internal Medicine

## 2020-04-05 DIAGNOSIS — R0602 Shortness of breath: Secondary | ICD-10-CM | POA: Diagnosis not present

## 2020-04-05 DIAGNOSIS — R339 Retention of urine, unspecified: Secondary | ICD-10-CM | POA: Diagnosis present

## 2020-04-05 DIAGNOSIS — K5730 Diverticulosis of large intestine without perforation or abscess without bleeding: Secondary | ICD-10-CM | POA: Diagnosis present

## 2020-04-05 DIAGNOSIS — E669 Obesity, unspecified: Secondary | ICD-10-CM

## 2020-04-05 DIAGNOSIS — Z800 Family history of malignant neoplasm of digestive organs: Secondary | ICD-10-CM | POA: Diagnosis not present

## 2020-04-05 DIAGNOSIS — I5033 Acute on chronic diastolic (congestive) heart failure: Secondary | ICD-10-CM | POA: Diagnosis not present

## 2020-04-05 DIAGNOSIS — I161 Hypertensive emergency: Secondary | ICD-10-CM | POA: Diagnosis present

## 2020-04-05 DIAGNOSIS — Z823 Family history of stroke: Secondary | ICD-10-CM

## 2020-04-05 DIAGNOSIS — E872 Acidosis: Secondary | ICD-10-CM | POA: Diagnosis not present

## 2020-04-05 DIAGNOSIS — Z885 Allergy status to narcotic agent status: Secondary | ICD-10-CM

## 2020-04-05 DIAGNOSIS — K219 Gastro-esophageal reflux disease without esophagitis: Secondary | ICD-10-CM | POA: Diagnosis present

## 2020-04-05 DIAGNOSIS — J9690 Respiratory failure, unspecified, unspecified whether with hypoxia or hypercapnia: Secondary | ICD-10-CM | POA: Diagnosis not present

## 2020-04-05 DIAGNOSIS — Z6837 Body mass index (BMI) 37.0-37.9, adult: Secondary | ICD-10-CM | POA: Diagnosis not present

## 2020-04-05 DIAGNOSIS — Z20822 Contact with and (suspected) exposure to covid-19: Secondary | ICD-10-CM | POA: Diagnosis not present

## 2020-04-05 DIAGNOSIS — R4182 Altered mental status, unspecified: Secondary | ICD-10-CM | POA: Diagnosis not present

## 2020-04-05 DIAGNOSIS — J810 Acute pulmonary edema: Secondary | ICD-10-CM | POA: Diagnosis not present

## 2020-04-05 DIAGNOSIS — G4730 Sleep apnea, unspecified: Secondary | ICD-10-CM | POA: Diagnosis not present

## 2020-04-05 DIAGNOSIS — Z79890 Hormone replacement therapy (postmenopausal): Secondary | ICD-10-CM

## 2020-04-05 DIAGNOSIS — K649 Unspecified hemorrhoids: Secondary | ICD-10-CM | POA: Diagnosis present

## 2020-04-05 DIAGNOSIS — J9600 Acute respiratory failure, unspecified whether with hypoxia or hypercapnia: Secondary | ICD-10-CM | POA: Diagnosis present

## 2020-04-05 DIAGNOSIS — N179 Acute kidney failure, unspecified: Secondary | ICD-10-CM

## 2020-04-05 DIAGNOSIS — J90 Pleural effusion, not elsewhere classified: Secondary | ICD-10-CM | POA: Diagnosis not present

## 2020-04-05 DIAGNOSIS — I10 Essential (primary) hypertension: Secondary | ICD-10-CM | POA: Diagnosis not present

## 2020-04-05 DIAGNOSIS — J9622 Acute and chronic respiratory failure with hypercapnia: Secondary | ICD-10-CM | POA: Diagnosis not present

## 2020-04-05 DIAGNOSIS — R0902 Hypoxemia: Secondary | ICD-10-CM | POA: Diagnosis not present

## 2020-04-05 DIAGNOSIS — R739 Hyperglycemia, unspecified: Secondary | ICD-10-CM | POA: Diagnosis present

## 2020-04-05 DIAGNOSIS — K449 Diaphragmatic hernia without obstruction or gangrene: Secondary | ICD-10-CM | POA: Diagnosis present

## 2020-04-05 DIAGNOSIS — M1990 Unspecified osteoarthritis, unspecified site: Secondary | ICD-10-CM | POA: Diagnosis present

## 2020-04-05 DIAGNOSIS — E662 Morbid (severe) obesity with alveolar hypoventilation: Secondary | ICD-10-CM | POA: Diagnosis not present

## 2020-04-05 DIAGNOSIS — I351 Nonrheumatic aortic (valve) insufficiency: Secondary | ICD-10-CM | POA: Diagnosis present

## 2020-04-05 DIAGNOSIS — E785 Hyperlipidemia, unspecified: Secondary | ICD-10-CM | POA: Diagnosis present

## 2020-04-05 DIAGNOSIS — R4781 Slurred speech: Secondary | ICD-10-CM | POA: Diagnosis not present

## 2020-04-05 DIAGNOSIS — J9811 Atelectasis: Secondary | ICD-10-CM | POA: Diagnosis present

## 2020-04-05 DIAGNOSIS — J9601 Acute respiratory failure with hypoxia: Secondary | ICD-10-CM | POA: Diagnosis not present

## 2020-04-05 DIAGNOSIS — G9341 Metabolic encephalopathy: Secondary | ICD-10-CM | POA: Diagnosis not present

## 2020-04-05 DIAGNOSIS — E875 Hyperkalemia: Secondary | ICD-10-CM | POA: Diagnosis present

## 2020-04-05 DIAGNOSIS — Z79899 Other long term (current) drug therapy: Secondary | ICD-10-CM

## 2020-04-05 DIAGNOSIS — Z8249 Family history of ischemic heart disease and other diseases of the circulatory system: Secondary | ICD-10-CM

## 2020-04-05 DIAGNOSIS — D751 Secondary polycythemia: Secondary | ICD-10-CM | POA: Diagnosis present

## 2020-04-05 DIAGNOSIS — Z888 Allergy status to other drugs, medicaments and biological substances status: Secondary | ICD-10-CM

## 2020-04-05 DIAGNOSIS — Z8719 Personal history of other diseases of the digestive system: Secondary | ICD-10-CM

## 2020-04-05 DIAGNOSIS — E039 Hypothyroidism, unspecified: Secondary | ICD-10-CM | POA: Diagnosis present

## 2020-04-05 DIAGNOSIS — N183 Chronic kidney disease, stage 3 unspecified: Secondary | ICD-10-CM | POA: Diagnosis present

## 2020-04-05 DIAGNOSIS — K2270 Barrett's esophagus without dysplasia: Secondary | ICD-10-CM | POA: Diagnosis present

## 2020-04-05 DIAGNOSIS — Z90710 Acquired absence of both cervix and uterus: Secondary | ICD-10-CM

## 2020-04-05 DIAGNOSIS — J9602 Acute respiratory failure with hypercapnia: Secondary | ICD-10-CM | POA: Diagnosis not present

## 2020-04-05 DIAGNOSIS — J9621 Acute and chronic respiratory failure with hypoxia: Secondary | ICD-10-CM | POA: Diagnosis not present

## 2020-04-05 DIAGNOSIS — N1832 Chronic kidney disease, stage 3b: Secondary | ICD-10-CM | POA: Diagnosis not present

## 2020-04-05 DIAGNOSIS — Z87891 Personal history of nicotine dependence: Secondary | ICD-10-CM

## 2020-04-05 DIAGNOSIS — I130 Hypertensive heart and chronic kidney disease with heart failure and stage 1 through stage 4 chronic kidney disease, or unspecified chronic kidney disease: Principal | ICD-10-CM | POA: Diagnosis present

## 2020-04-05 DIAGNOSIS — I517 Cardiomegaly: Secondary | ICD-10-CM | POA: Diagnosis not present

## 2020-04-05 DIAGNOSIS — Z886 Allergy status to analgesic agent status: Secondary | ICD-10-CM

## 2020-04-05 HISTORY — DX: Sleep apnea, unspecified: G47.30

## 2020-04-05 HISTORY — DX: Chronic kidney disease, unspecified: N18.9

## 2020-04-05 HISTORY — DX: Anemia, unspecified: D64.9

## 2020-04-05 LAB — I-STAT VENOUS BLOOD GAS, ED
Acid-Base Excess: 3 mmol/L — ABNORMAL HIGH (ref 0.0–2.0)
Bicarbonate: 33.4 mmol/L — ABNORMAL HIGH (ref 20.0–28.0)
Calcium, Ion: 1.23 mmol/L (ref 1.15–1.40)
HCT: 50 % — ABNORMAL HIGH (ref 36.0–46.0)
Hemoglobin: 17 g/dL — ABNORMAL HIGH (ref 12.0–15.0)
O2 Saturation: 81 %
Potassium: 5.4 mmol/L — ABNORMAL HIGH (ref 3.5–5.1)
Sodium: 141 mmol/L (ref 135–145)
TCO2: 36 mmol/L — ABNORMAL HIGH (ref 22–32)
pCO2, Ven: 74.3 mmHg (ref 44.0–60.0)
pH, Ven: 7.261 (ref 7.250–7.430)
pO2, Ven: 54 mmHg — ABNORMAL HIGH (ref 32.0–45.0)

## 2020-04-05 LAB — DIFFERENTIAL
Abs Immature Granulocytes: 0.09 10*3/uL — ABNORMAL HIGH (ref 0.00–0.07)
Basophils Absolute: 0.1 10*3/uL (ref 0.0–0.1)
Basophils Relative: 1 %
Eosinophils Absolute: 0.2 10*3/uL (ref 0.0–0.5)
Eosinophils Relative: 2 %
Immature Granulocytes: 1 %
Lymphocytes Relative: 9 %
Lymphs Abs: 0.8 10*3/uL (ref 0.7–4.0)
Monocytes Absolute: 0.5 10*3/uL (ref 0.1–1.0)
Monocytes Relative: 6 %
Neutro Abs: 6.8 10*3/uL (ref 1.7–7.7)
Neutrophils Relative %: 81 %

## 2020-04-05 LAB — URINALYSIS, ROUTINE W REFLEX MICROSCOPIC
Bilirubin Urine: NEGATIVE
Glucose, UA: NEGATIVE mg/dL
Hgb urine dipstick: NEGATIVE
Ketones, ur: NEGATIVE mg/dL
Leukocytes,Ua: NEGATIVE
Nitrite: NEGATIVE
Protein, ur: >=300 mg/dL — AB
Specific Gravity, Urine: 1.019 (ref 1.005–1.030)
pH: 5 (ref 5.0–8.0)

## 2020-04-05 LAB — I-STAT ARTERIAL BLOOD GAS, ED
Acid-Base Excess: 1 mmol/L (ref 0.0–2.0)
Acid-Base Excess: 2 mmol/L (ref 0.0–2.0)
Acid-Base Excess: 2 mmol/L (ref 0.0–2.0)
Bicarbonate: 31.8 mmol/L — ABNORMAL HIGH (ref 20.0–28.0)
Bicarbonate: 33.5 mmol/L — ABNORMAL HIGH (ref 20.0–28.0)
Bicarbonate: 31.2 mmol/L — ABNORMAL HIGH (ref 20.0–28.0)
Calcium, Ion: 1.39 mmol/L (ref 1.15–1.40)
Calcium, Ion: 1.37 mmol/L (ref 1.15–1.40)
Calcium, Ion: 1.39 mmol/L (ref 1.15–1.40)
HCT: 47 % — ABNORMAL HIGH (ref 36.0–46.0)
HCT: 46 % (ref 36.0–46.0)
HCT: 46 % (ref 36.0–46.0)
Hemoglobin: 16 g/dL — ABNORMAL HIGH (ref 12.0–15.0)
Hemoglobin: 15.6 g/dL — ABNORMAL HIGH (ref 12.0–15.0)
Hemoglobin: 15.6 g/dL — ABNORMAL HIGH (ref 12.0–15.0)
O2 Saturation: 90 %
O2 Saturation: 87 %
O2 Saturation: 96 %
Patient temperature: 97.8
Patient temperature: 98.6
Potassium: 4.7 mmol/L (ref 3.5–5.1)
Potassium: 4.9 mmol/L (ref 3.5–5.1)
Potassium: 5 mmol/L (ref 3.5–5.1)
Sodium: 141 mmol/L (ref 135–145)
Sodium: 139 mmol/L (ref 135–145)
Sodium: 140 mmol/L (ref 135–145)
TCO2: 34 mmol/L — ABNORMAL HIGH (ref 22–32)
TCO2: 36 mmol/L — ABNORMAL HIGH (ref 22–32)
TCO2: 33 mmol/L — ABNORMAL HIGH (ref 22–32)
pCO2 arterial: 78.5 mmHg (ref 32.0–48.0)
pCO2 arterial: 82.5 mmHg (ref 32.0–48.0)
pCO2 arterial: 69 mmHg (ref 32.0–48.0)
pH, Arterial: 7.215 — ABNORMAL LOW (ref 7.350–7.450)
pH, Arterial: 7.214 — ABNORMAL LOW (ref 7.350–7.450)
pH, Arterial: 7.264 — ABNORMAL LOW (ref 7.350–7.450)
pO2, Arterial: 74 mmHg — ABNORMAL LOW (ref 83.0–108.0)
pO2, Arterial: 65 mmHg — ABNORMAL LOW (ref 83.0–108.0)
pO2, Arterial: 101 mmHg (ref 83.0–108.0)

## 2020-04-05 LAB — CBC
HCT: 47.7 % — ABNORMAL HIGH (ref 36.0–46.0)
Hemoglobin: 13.7 g/dL (ref 12.0–15.0)
MCH: 25.8 pg — ABNORMAL LOW (ref 26.0–34.0)
MCHC: 28.7 g/dL — ABNORMAL LOW (ref 30.0–36.0)
MCV: 90 fL (ref 80.0–100.0)
Platelets: 261 10*3/uL (ref 150–400)
RBC: 5.3 MIL/uL — ABNORMAL HIGH (ref 3.87–5.11)
RDW: 17.1 % — ABNORMAL HIGH (ref 11.5–15.5)
WBC: 8.4 10*3/uL (ref 4.0–10.5)
nRBC: 3 % — ABNORMAL HIGH (ref 0.0–0.2)

## 2020-04-05 LAB — GLUCOSE, CAPILLARY: Glucose-Capillary: 103 mg/dL — ABNORMAL HIGH (ref 70–99)

## 2020-04-05 LAB — COMPREHENSIVE METABOLIC PANEL WITH GFR
ALT: 15 U/L (ref 0–44)
AST: 14 U/L — ABNORMAL LOW (ref 15–41)
Albumin: 3.2 g/dL — ABNORMAL LOW (ref 3.5–5.0)
Alkaline Phosphatase: 100 U/L (ref 38–126)
Anion gap: 7 (ref 5–15)
BUN: 20 mg/dL (ref 8–23)
CO2: 31 mmol/L (ref 22–32)
Calcium: 9.4 mg/dL (ref 8.9–10.3)
Chloride: 101 mmol/L (ref 98–111)
Creatinine, Ser: 1.42 mg/dL — ABNORMAL HIGH (ref 0.44–1.00)
GFR, Estimated: 38 mL/min — ABNORMAL LOW
Glucose, Bld: 160 mg/dL — ABNORMAL HIGH (ref 70–99)
Potassium: 4.5 mmol/L (ref 3.5–5.1)
Sodium: 139 mmol/L (ref 135–145)
Total Bilirubin: 0.4 mg/dL (ref 0.3–1.2)
Total Protein: 7.8 g/dL (ref 6.5–8.1)

## 2020-04-05 LAB — RAPID URINE DRUG SCREEN, HOSP PERFORMED
Amphetamines: NOT DETECTED
Barbiturates: NOT DETECTED
Benzodiazepines: NOT DETECTED
Cocaine: NOT DETECTED
Opiates: NOT DETECTED
Tetrahydrocannabinol: NOT DETECTED

## 2020-04-05 LAB — TROPONIN I (HIGH SENSITIVITY)
Troponin I (High Sensitivity): 15 ng/L (ref <18)
Troponin I (High Sensitivity): 16 ng/L (ref <18)

## 2020-04-05 LAB — RESPIRATORY PANEL BY RT PCR (FLU A&B, COVID)
Influenza A by PCR: NEGATIVE
Influenza B by PCR: NEGATIVE
SARS Coronavirus 2 by RT PCR: NEGATIVE

## 2020-04-05 LAB — BRAIN NATRIURETIC PEPTIDE: B Natriuretic Peptide: 872.6 pg/mL — ABNORMAL HIGH (ref 0.0–100.0)

## 2020-04-05 LAB — AMMONIA: Ammonia: 35 umol/L (ref 9–35)

## 2020-04-05 LAB — PROTIME-INR
INR: 1 (ref 0.8–1.2)
Prothrombin Time: 12.4 s (ref 11.4–15.2)

## 2020-04-05 LAB — CBG MONITORING, ED
Glucose-Capillary: 137 mg/dL — ABNORMAL HIGH (ref 70–99)
Glucose-Capillary: 110 mg/dL — ABNORMAL HIGH (ref 70–99)

## 2020-04-05 LAB — APTT: aPTT: 30 s (ref 24–36)

## 2020-04-05 LAB — ETHANOL: Alcohol, Ethyl (B): <10 mg/dL (ref <10)

## 2020-04-05 LAB — TSH: TSH: 2.558 u[IU]/mL (ref 0.350–4.500)

## 2020-04-05 MED ORDER — ALBUTEROL SULFATE HFA 108 (90 BASE) MCG/ACT IN AERS
4.0000 | INHALATION_SPRAY | Freq: Once | RESPIRATORY_TRACT | Status: DC
  Filled 2020-04-05: qty 6.7

## 2020-04-05 MED ORDER — IRBESARTAN 75 MG PO TABS
37.5000 mg | ORAL_TABLET | Freq: Once | ORAL | Status: DC
  Filled 2020-04-05: qty 0.5

## 2020-04-05 MED ORDER — ATORVASTATIN CALCIUM 10 MG PO TABS
10.0000 mg | ORAL_TABLET | Freq: Every day | ORAL | Status: DC
  Administered 2020-04-06 – 2020-04-12 (×7): 10 mg via ORAL
  Filled 2020-04-05 (×9): qty 1

## 2020-04-05 MED ORDER — FUROSEMIDE 10 MG/ML IJ SOLN
40.0000 mg | Freq: Three times a day (TID) | INTRAMUSCULAR | Status: AC
  Administered 2020-04-05 – 2020-04-06 (×2): 40 mg via INTRAVENOUS
  Filled 2020-04-05 (×2): qty 4

## 2020-04-05 MED ORDER — CARVEDILOL 12.5 MG PO TABS
12.5000 mg | ORAL_TABLET | Freq: Two times a day (BID) | ORAL | Status: DC
  Administered 2020-04-05 – 2020-04-12 (×12): 12.5 mg via ORAL
  Filled 2020-04-05 (×16): qty 1

## 2020-04-05 MED ORDER — CHLORHEXIDINE GLUCONATE CLOTH 2 % EX PADS
6.0000 | MEDICATED_PAD | Freq: Every day | CUTANEOUS | Status: DC
  Administered 2020-04-05 – 2020-04-11 (×6): 6 via TOPICAL

## 2020-04-05 MED ORDER — POLYETHYLENE GLYCOL 3350 17 G PO PACK: 17.0000 g | PACK | Freq: Every day | ORAL | Status: DC | PRN

## 2020-04-05 MED ORDER — DOCUSATE SODIUM 100 MG PO CAPS: 100.0000 mg | ORAL_CAPSULE | Freq: Two times a day (BID) | ORAL | Status: DC | PRN

## 2020-04-05 MED ORDER — IOHEXOL 350 MG/ML SOLN
60.0000 mL | Freq: Once | INTRAVENOUS | Status: AC | PRN
  Administered 2020-04-05: 60 mL via INTRAVENOUS

## 2020-04-05 MED ORDER — SODIUM CHLORIDE 0.9% FLUSH: 3.0000 mL | Freq: Once | INTRAVENOUS | Status: DC

## 2020-04-05 MED ORDER — CLONIDINE HCL 0.1 MG PO TABS
0.1000 mg | ORAL_TABLET | Freq: Once | ORAL | Status: AC
  Administered 2020-04-05: 0.1 mg via ORAL
  Filled 2020-04-05: qty 1

## 2020-04-05 MED ORDER — IRBESARTAN 300 MG PO TABS
300.0000 mg | ORAL_TABLET | Freq: Once | ORAL | Status: DC
  Filled 2020-04-05: qty 1

## 2020-04-05 MED ORDER — CHLORHEXIDINE GLUCONATE 0.12% ORAL RINSE (MEDLINE KIT)
15.0000 mL | Freq: Two times a day (BID) | OROMUCOSAL | Status: DC
  Administered 2020-04-06 – 2020-04-12 (×10): 15 mL via OROMUCOSAL

## 2020-04-05 MED ORDER — FUROSEMIDE 10 MG/ML IJ SOLN
40.0000 mg | Freq: Once | INTRAMUSCULAR | Status: AC
  Administered 2020-04-05: 40 mg via INTRAVENOUS
  Filled 2020-04-05: qty 4

## 2020-04-05 MED ORDER — INSULIN ASPART 100 UNIT/ML ~~LOC~~ SOLN
2.0000 [IU] | SUBCUTANEOUS | Status: DC
  Administered 2020-04-06: 4 [IU] via SUBCUTANEOUS
  Administered 2020-04-07 – 2020-04-08 (×2): 2 [IU] via SUBCUTANEOUS

## 2020-04-05 MED ORDER — LEVOTHYROXINE SODIUM 112 MCG PO TABS
112.0000 ug | ORAL_TABLET | Freq: Every day | ORAL | Status: DC
  Administered 2020-04-07 – 2020-04-12 (×6): 112 ug via ORAL
  Filled 2020-04-05 (×7): qty 1

## 2020-04-05 MED ORDER — HYDRALAZINE HCL 20 MG/ML IJ SOLN
10.0000 mg | INTRAMUSCULAR | Status: DC | PRN
  Administered 2020-04-05: 10 mg via INTRAVENOUS
  Filled 2020-04-05: qty 1

## 2020-04-05 MED ORDER — ORAL CARE MOUTH RINSE
15.0000 mL | OROMUCOSAL | Status: DC
  Administered 2020-04-06 – 2020-04-08 (×11): 15 mL via OROMUCOSAL

## 2020-04-05 MED ORDER — HEPARIN SODIUM (PORCINE) 5000 UNIT/ML IJ SOLN
5000.0000 [IU] | Freq: Three times a day (TID) | INTRAMUSCULAR | Status: DC
  Administered 2020-04-05 – 2020-04-12 (×18): 5000 [IU] via SUBCUTANEOUS
  Filled 2020-04-05 (×18): qty 1

## 2020-04-05 NOTE — ED Provider Notes (Signed)
Care received from [facility]. Please see her note for full HPI.  In short, 74 year old female woke up this morning feeling short of breath, speech seemed "off" per daughter, questionably slurred.  EMS was called, found the patient to be hypoxic in the 80s, arrived to the ED on 6 L nasal cannula.  Patient has no history of COPD, not a current smoker but with remote, no home O2 oxygen requirements. Per chart review pt was evaluated for sleep apnea however there was no followup.  No fevers, chest pain.  Prior history of hypertension.  No other sick contacts at home.  No noticeable swelling to the lower extremities.  On arrival she was alert and oriented x3, however poor historian.  Sats were in the upper 90s at 6 L.  Work-up initiated by the previous provider, followed up on the labs.  Her CBC was without leukocytosis, normal hemoglobin.  CMP without any electrolyte abnormalities, creatinine is at baseline.  PT/INR and APTT normal.  Negative ethanol.  Negative ammonia.  BN P elevated at 872.  Chest x-ray consistent with pulmonary edema and cardiomegaly.  CT of the head without abnormalities.  Previous provider thought that one of her extremities may have been larger than the other and thus a CTA was ordered.  On my reevaluation, patient becoming increasingly confused, will not answer questions.  Daughter at bedside states she is getting more confused as well.  I-STAT blood gas with evidence of hypoxic hypercarbic respiratory failure.  CTA is negative for PE.  Patient was given home BP meds, IV Lasix, placed on BiPAP. Suspect symptoms are secondary to new onset heart failure in the setting of untreated sleep apnea.  Repeat ABG with worsening CO2. CC consulted. They will admit for further evaluation and treatment.   Physical Exam  BP (!) 163/83   Pulse 68   Temp 97.8 F (36.6 C) (Oral)   Resp (!) 22   Ht 5\' 6"  (1.676 m)   Wt 106.6 kg   SpO2 95%   BMI 37.93 kg/m   Physical Exam Vitals and  nursing note reviewed.  Constitutional:      General: She is not in acute distress.    Appearance: She is well-developed. She is obese. She is ill-appearing.  HENT:     Head: Normocephalic and atraumatic.  Eyes:     Conjunctiva/sclera: Conjunctivae normal.  Cardiovascular:     Rate and Rhythm: Normal rate and regular rhythm.     Heart sounds: No murmur heard.   Pulmonary:     Effort: Tachypnea and accessory muscle usage present. No respiratory distress.     Breath sounds: Examination of the right-upper field reveals wheezing. Examination of the left-upper field reveals wheezing. Wheezing present.  Chest:     Chest wall: No tenderness.  Abdominal:     Palpations: Abdomen is soft.     Tenderness: There is no abdominal tenderness.  Musculoskeletal:        General: Normal range of motion.     Cervical back: Neck supple.     Right lower leg: No tenderness. Edema present.     Left lower leg: No tenderness. Edema present.  Skin:    General: Skin is warm and dry.  Neurological:     Mental Status: She is alert.     Comments: Will not follow commands, combative  Psychiatric:        Mood and Affect: Mood normal.        Behavior: Behavior normal.  ED Course/Procedures    Results for orders placed or performed during the hospital encounter of 04/05/20  Respiratory Panel by RT PCR (Flu A&B, Covid) - Nasopharyngeal Swab   Specimen: Nasopharyngeal Swab; Nasopharyngeal(NP) swabs in vial transport medium  Result Value Ref Range   SARS Coronavirus 2 by RT PCR NEGATIVE NEGATIVE   Influenza A by PCR NEGATIVE NEGATIVE   Influenza B by PCR NEGATIVE NEGATIVE  Protime-INR  Result Value Ref Range   Prothrombin Time 12.4 11.4 - 15.2 seconds   INR 1.0 0.8 - 1.2  APTT  Result Value Ref Range   aPTT 30 24 - 36 seconds  CBC  Result Value Ref Range   WBC 8.4 4.0 - 10.5 K/uL   RBC 5.30 (H) 3.87 - 5.11 MIL/uL   Hemoglobin 13.7 12.0 - 15.0 g/dL   HCT 96.7 (H) 36 - 46 %   MCV 90.0 80.0 -  100.0 fL   MCH 25.8 (L) 26.0 - 34.0 pg   MCHC 28.7 (L) 30.0 - 36.0 g/dL   RDW 89.3 (H) 81.0 - 17.5 %   Platelets 261 150 - 400 K/uL   nRBC 3.0 (H) 0.0 - 0.2 %  Differential  Result Value Ref Range   Neutrophils Relative % 81 %   Neutro Abs 6.8 1.7 - 7.7 K/uL   Lymphocytes Relative 9 %   Lymphs Abs 0.8 0.7 - 4.0 K/uL   Monocytes Relative 6 %   Monocytes Absolute 0.5 0.1 - 1.0 K/uL   Eosinophils Relative 2 %   Eosinophils Absolute 0.2 0.0 - 0.5 K/uL   Basophils Relative 1 %   Basophils Absolute 0.1 0.0 - 0.1 K/uL   Immature Granulocytes 1 %   Abs Immature Granulocytes 0.09 (H) 0.00 - 0.07 K/uL  Comprehensive metabolic panel  Result Value Ref Range   Sodium 139 135 - 145 mmol/L   Potassium 4.5 3.5 - 5.1 mmol/L   Chloride 101 98 - 111 mmol/L   CO2 31 22 - 32 mmol/L   Glucose, Bld 160 (H) 70 - 99 mg/dL   BUN 20 8 - 23 mg/dL   Creatinine, Ser 1.02 (H) 0.44 - 1.00 mg/dL   Calcium 9.4 8.9 - 58.5 mg/dL   Total Protein 7.8 6.5 - 8.1 g/dL   Albumin 3.2 (L) 3.5 - 5.0 g/dL   AST 14 (L) 15 - 41 U/L   ALT 15 0 - 44 U/L   Alkaline Phosphatase 100 38 - 126 U/L   Total Bilirubin 0.4 0.3 - 1.2 mg/dL   GFR, Estimated 38 (L) >60 mL/min   Anion gap 7 5 - 15  Ethanol  Result Value Ref Range   Alcohol, Ethyl (B) <10 <10 mg/dL  Urine rapid drug screen (hosp performed)  Result Value Ref Range   Opiates NONE DETECTED NONE DETECTED   Cocaine NONE DETECTED NONE DETECTED   Benzodiazepines NONE DETECTED NONE DETECTED   Amphetamines NONE DETECTED NONE DETECTED   Tetrahydrocannabinol NONE DETECTED NONE DETECTED   Barbiturates NONE DETECTED NONE DETECTED  Urinalysis, Routine w reflex microscopic  Result Value Ref Range   Color, Urine YELLOW YELLOW   APPearance CLEAR CLEAR   Specific Gravity, Urine 1.019 1.005 - 1.030   pH 5.0 5.0 - 8.0   Glucose, UA NEGATIVE NEGATIVE mg/dL   Hgb urine dipstick NEGATIVE NEGATIVE   Bilirubin Urine NEGATIVE NEGATIVE   Ketones, ur NEGATIVE NEGATIVE mg/dL    Protein, ur >=277 (A) NEGATIVE mg/dL   Nitrite NEGATIVE NEGATIVE   Leukocytes,Ua  NEGATIVE NEGATIVE   RBC / HPF 0-5 0 - 5 RBC/hpf   WBC, UA 0-5 0 - 5 WBC/hpf   Bacteria, UA RARE (A) NONE SEEN   Squamous Epithelial / LPF 0-5 0 - 5  Brain natriuretic peptide  Result Value Ref Range   B Natriuretic Peptide 872.6 (H) 0.0 - 100.0 pg/mL  Ammonia  Result Value Ref Range   Ammonia 35 9 - 35 umol/L  CBG monitoring, ED  Result Value Ref Range   Glucose-Capillary 137 (H) 70 - 99 mg/dL  I-Stat arterial blood gas, [facility] ED)  Result Value Ref Range   pH, Arterial 7.215 (L) 7.35 - 7.45   pCO2 arterial 78.5 (HH) 32 - 48 mmHg   pO2, Arterial 74 (L) 83 - 108 mmHg   Bicarbonate 31.8 (H) 20.0 - 28.0 mmol/L   TCO2 34 (H) 22 - 32 mmol/L   O2 Saturation 90.0 %   Acid-Base Excess 1.0 0.0 - 2.0 mmol/L   Sodium 141 135 - 145 mmol/L   Potassium 4.7 3.5 - 5.1 mmol/L   Calcium, Ion 1.39 1.15 - 1.40 mmol/L   HCT 47.0 (H) 36 - 46 %   Hemoglobin 16.0 (H) 12.0 - 15.0 g/dL   Collection site Radial    Drawn by Operator    Sample type ARTERIAL    Comment NOTIFIED PHYSICIAN   I-Stat venous blood gas, ED  Result Value Ref Range   pH, Ven 7.261 7.25 - 7.43   pCO2, Ven 74.3 (HH) 44 - 60 mmHg   pO2, Ven 54.0 (H) 32 - 45 mmHg   Bicarbonate 33.4 (H) 20.0 - 28.0 mmol/L   TCO2 36 (H) 22 - 32 mmol/L   O2 Saturation 81.0 %   Acid-Base Excess 3.0 (H) 0.0 - 2.0 mmol/L   Sodium 141 135 - 145 mmol/L   Potassium 5.4 (H) 3.5 - 5.1 mmol/L   Calcium, Ion 1.23 1.15 - 1.40 mmol/L   HCT 50.0 (H) 36 - 46 %   Hemoglobin 17.0 (H) 12.0 - 15.0 g/dL   Sample type VENOUS    Comment NOTIFIED PHYSICIAN   I-Stat arterial blood gas, [facility] ED)  Result Value Ref Range   pH, Arterial 7.214 (L) 7.35 - 7.45   pCO2 arterial 82.5 (HH) 32 - 48 mmHg   pO2, Arterial 65 (L) 83 - 108 mmHg   Bicarbonate 33.5 (H) 20.0 - 28.0 mmol/L   TCO2 36 (H) 22 - 32 mmol/L   O2 Saturation 87.0 %   Acid-Base Excess 2.0 0.0 - 2.0 mmol/L   Sodium 139 135 - 145  mmol/L   Potassium 4.9 3.5 - 5.1 mmol/L   Calcium, Ion 1.37 1.15 - 1.40 mmol/L   HCT 46.0 36 - 46 %   Hemoglobin 15.6 (H) 12.0 - 15.0 g/dL   Patient temperature 78.4 F    Collection site Radial    Drawn by RT    Sample type ARTERIAL    Comment NOTIFIED PHYSICIAN   I-Stat arterial blood gas, ED  Result Value Ref Range   pH, Arterial 7.264 (L) 7.35 - 7.45   pCO2 arterial 69.0 (HH) 32 - 48 mmHg   pO2, Arterial 101 83 - 108 mmHg   Bicarbonate 31.2 (H) 20.0 - 28.0 mmol/L   TCO2 33 (H) 22 - 32 mmol/L   O2 Saturation 96.0 %   Acid-Base Excess 2.0 0.0 - 2.0 mmol/L   Sodium 140 135 - 145 mmol/L   Potassium 5.0 3.5 - 5.1 mmol/L  Calcium, Ion 1.39 1.15 - 1.40 mmol/L   HCT 46.0 36 - 46 %   Hemoglobin 15.6 (H) 12.0 - 15.0 g/dL   Patient temperature 16.198.6 F    Collection site Radial    Drawn by Operator    Sample type ARTERIAL    Comment NOTIFIED PHYSICIAN   CBG monitoring, ED  Result Value Ref Range   Glucose-Capillary 110 (H) 70 - 99 mg/dL  Troponin I (High Sensitivity)  Result Value Ref Range   Troponin I (High Sensitivity) 15 <18 ng/L  Troponin I (High Sensitivity)  Result Value Ref Range   Troponin I (High Sensitivity) 16 <18 ng/L   DG Chest 2 View  Result Date: 04/05/2020 CLINICAL DATA:  Shortness of breath. EXAM: CHEST - 2 VIEW COMPARISON:  May 29, 2012 FINDINGS: Cardiomegaly. The hila and mediastinum are unremarkable. Bilateral interstitial opacities, most prominent the bases. No pneumothorax. No other acute abnormalities. IMPRESSION: Findings are most consistent with cardiomegaly and pulmonary edema. Electronically Signed   By: Gerome Samavid  Williams III M.D   On: 04/05/2020 14:58   CT HEAD WO CONTRAST  Result Date: 04/05/2020 CLINICAL DATA:  Mental status change. EXAM: CT HEAD WITHOUT CONTRAST TECHNIQUE: Contiguous axial images were obtained from the base of the skull through the vertex without intravenous contrast. COMPARISON:  None. FINDINGS: Brain: No subdural, epidural, or  subarachnoid hemorrhage. Ventricles and sulci are unremarkable. No acute cortical ischemia or infarct. No mass effect or midline shift. Basal gangliar calcifications are identified, of no acute significance. Cerebellum, brainstem, and basal cisterns are normal. Vascular: Calcified atherosclerosis in the intracranial carotids. Skull: Normal. Negative for fracture or focal lesion. Sinuses/Orbits: No acute finding. Other: None. IMPRESSION: No acute intracranial abnormalities. Electronically Signed   By: Gerome Samavid  Williams III M.D   On: 04/05/2020 14:57   CT Angio Chest PE W and/or Wo Contrast  Result Date: 04/05/2020 CLINICAL DATA:  Hypoxia. Slurred speech. Possible pulmonary embolism. EXAM: CT ANGIOGRAPHY CHEST WITH CONTRAST TECHNIQUE: Multidetector CT imaging of the chest was performed using the standard protocol during bolus administration of intravenous contrast. Multiplanar CT image reconstructions and MIPs were obtained to evaluate the vascular anatomy. CONTRAST:  60mL OMNIPAQUE IOHEXOL 350 MG/ML SOLN COMPARISON:  CT abdomen 11/12/2013 FINDINGS: Cardiovascular: Mild cardiomegaly thoracic aorta is normal in caliber. Pulmonary arterial system demonstrates no evidence of pulmonary emboli. Remaining vascular structures are unremarkable. Mediastinum/Nodes: 1.5 cm precarinal lymph no other significant mediastinal or hilar adenopathy. Remaining mediastinal structures are unremarkable. Lungs/Pleura: Lungs are somewhat hypoinflated demonstrate a small right pleural effusion. Mild hazy patchy density over the lung bases likely atelectasis and less likely infection. Subtle hazy perihilar density which may be due to edema. Airways are within normal. Upper Abdomen: A couple liver cysts are present unchanged. Musculoskeletal: Degenerative changes of the spine. Review of the MIP images confirms the above findings. IMPRESSION: 1. No evidence of pulmonary embolism. 2. Small right pleural effusion. Minimal patchy hazy density  over the lung bases likely atelectasis and less likely infection. Subtle hazy perihilar density which may be due to edema. 3. Mild cardiomegaly. 4. Two liver cysts unchanged. Electronically Signed   By: Elberta Fortisaniel  Boyle M.D.   On: 04/05/2020 17:27   US RENAL  Result Date: 03/13/2020 CLINICAL DATA:  Chronic kidney disease EXAM: RENAL / URINARY TRACT ULTRASOUND COMPLETE COMPARISON:  CT 11/12/2013 FINDINGS: Right Kidney: Renal measurements: 10.4 x 4.3 x 4.5 cm = volume: 105 mL. Echogenicity within normal limits. No mass or hydronephrosis visualized. Left Kidney: Renal measurements: 10.3 x  4.5 x 4.9 cm = volume: 120 mL. Echogenicity within normal limits. No mass or hydronephrosis visualized. Bladder: Appears normal for degree of bladder distention. Other: Incidental note made of septated liver cyst measuring 5 cm, appears to correspond to hepatic cyst on previous CT but is slightly increased in size. IMPRESSION: Normal ultrasound appearance of the kidneys. Electronically Signed   By: Jasmine Pang M.D.   On: 03/13/2020 23:08   LONG TERM MONITOR (3-14 DAYS)  Result Date: 03/17/2020 A ZIO monitor was performed for 7 days beginning 02/27/2020 to evaluate palpitation. The cardiac rhythm throughout was sinus with average, minimum of maximum heart rates of 58, 39 and 99 bpm. There were no pauses of 3 seconds or greater and no episodes of second or third-degree AV block. There were no triggered or diary events. Ventricular ectopy was rare with isolated PVCs and couplets. Occasional supraventricular ectopy was seen with APCs and no episodes of atrial fibrillation or flutter. There were 15 brief episodes of atrial tachycardia the longest lasting 17 complexes at 136 bpm, atrial tachycardia. Conclusion brief runs of atrial premature contractions with occasional supraventricular ectopy.  These episodes were asymptomatic.   Procedures  MDM         Leone Brand 04/05/20 2111    Linwood Dibbles, MD 04/06/20  [phone]

## 2020-04-05 NOTE — Progress Notes (Signed)
Critical ABG values given to Dr. Lynelle Doctor.

## 2020-04-05 NOTE — ED Provider Notes (Signed)
[facility] EMERGENCY DEPARTMENT Provider Note   CSN: [id number] Arrival date & time: 04/05/20  1311     History Chief Complaint  Patient presents with  . Shortness of Breath  . Slurred Speech    Amanda Bauer is a 74 y.o. female who presents to the ed with hypoxia and slurred speech.  History is given by the patient.  She arrives by [facility] for onset of shortness of breath.  She states that she woke with shortness of breath.  She is found to have oxygen saturations in the 80s on room air and is currently satting at 94% on 6 L.  She is alert and oriented to person place time and event.  She does not feel as though her speech is altered.  She denies feeling short of breath.  She denies chest pain.  She is never had anything like this before.  She is not on oxygen at home.  She has a history of hypertension.  HPI     Past Medical History:  Diagnosis Date  . Barrett esophagus   . Colon polyps   . Diverticula of colon    Left colon  . Hemorrhoids    internal   . Hiatal hernia   . Hyperlipidemia   . Hypertension   . Hypothyroidism 02/20/2020  . Osteoarthritis 02/20/2020    Patient Active Problem List   Diagnosis Date Noted  . CKD (chronic kidney disease) stage 3, GFR 30-59 ml/min (HCC) 02/27/2020  . Aortic regurgitation 02/27/2020  . Hypertension 02/20/2020  . Hypothyroidism 02/20/2020  . Osteoarthritis 02/20/2020  . Hyperlipidemia   . Hiatal hernia   . Hemorrhoids   . Diverticula of colon   . Colon polyps   . Barrett esophagus     Past Surgical History:  Procedure Laterality Date  . complete hysterectomy       OB History   No obstetric history on file.     Family History  Problem Relation Age of Onset  . Hypertension Mother   . Stomach cancer Mother   . Stroke Father   . Cerebral aneurysm Other   . Cancer Other        GI Tract    Social History   Tobacco Use  . Smoking status: Former Games developer  . Smokeless tobacco: Never  Used  Vaping Use  . Vaping Use: Never used  Substance Use Topics  . Alcohol use: Yes    Comment: occasional  . Drug use: Never    Home Medications Prior to Admission medications   Medication Sig Start Date End Date Taking? Authorizing Provider  carvedilol (COREG) 12.5 MG tablet Take 1 tablet (12.5 mg total) by mouth 2 (two) times daily. 02/27/20 05/27/20  Baldo Daub, MD  cloNIDine (CATAPRES) 0.1 MG tablet Take 0.1 mg by mouth daily as needed.    [provider]  levothyroxine (SYNTHROID) 112 MCG tablet Take 112 mcg by mouth daily before breakfast.    [provider]  valsartan (DIOVAN) 320 MG tablet Take 320 mg by mouth daily.  02/13/20   [provider]    Allergies    Aspirin, Pravastatin, and Tramadol  Review of Systems   Review of Systems Ten systems reviewed and are negative for acute change, except as noted in the HPI.   Physical Exam Updated Vital Signs BP (!) 172/109 (BP Location: Right Arm)   Pulse 60   Temp 97.8 F (36.6 C) (Oral)   Resp (!) 22  Ht 5\' 6"  (1.676 m)   Wt 106.6 kg   SpO2 97%   BMI 37.93 kg/m   Physical Exam Vitals and nursing note reviewed.  Constitutional:      General: She is not in acute distress.    Appearance: She is well-developed. She is not diaphoretic.  HENT:     Head: Normocephalic and atraumatic.  Eyes:     General: No scleral icterus.    Conjunctiva/sclera: Conjunctivae normal.  Cardiovascular:     Rate and Rhythm: Normal rate and regular rhythm.     Heart sounds: Normal heart sounds. No murmur heard.  No friction rub. No gallop.   Pulmonary:     Effort: Pulmonary effort is normal. No respiratory distress.     Breath sounds: Normal breath sounds.  Abdominal:     General: Bowel sounds are normal. There is no distension.     Palpations: Abdomen is soft. There is no mass.     Tenderness: There is no abdominal tenderness. There is no guarding.  Musculoskeletal:     Cervical back: Normal range of  motion.  Skin:    General: Skin is warm and dry.  Neurological:     Mental Status: She is alert and oriented to person, place, and time.     Comments: Speech is DYSARTHRIC but goal oriented, follows commands Major Cranial nerves without deficit, no facial droop Normal strength in upper and lower extremities bilaterally including dorsiflexion and plantar flexion, strong and equal grip strength Sensation normal to light and sharp touch Moves extremities without ataxia, coordination intact Normal finger to nose and rapid alternating movements Neg romberg, no pronator drift Gait deferred Normal heel-shin and balance   Psychiatric:        Behavior: Behavior normal.     ED Results / Procedures / Treatments   Labs (all labs ordered are listed, but only abnormal results are displayed) Labs Reviewed  CBC - Abnormal; Notable for the following components:      Result Value   RBC 5.30 (*)    HCT 47.7 (*)    MCH 25.8 (*)    MCHC 28.7 (*)    RDW 17.1 (*)    All other components within normal limits  CBG MONITORING, ED - Abnormal; Notable for the following components:   Glucose-Capillary 137 (*)    All other components within normal limits  PROTIME-INR  APTT  DIFFERENTIAL  COMPREHENSIVE METABOLIC PANEL    EKG None  Radiology No results found.  Procedures .Critical Care Performed by: , PA-C Authorized by: Arthor Captain, PA-C   Critical care provider statement:    Critical care time (minutes):  45   Critical care time was exclusive of:  Separately billable procedures and treating other patients   Critical care was necessary to treat or prevent imminent or life-threatening deterioration of the following conditions:  Respiratory failure   Critical care was time spent personally by me on the following activities:  Discussions with consultants, evaluation of patient's response to treatment, examination of patient, ordering and performing treatments and  interventions, ordering and review of laboratory studies, ordering and review of radiographic studies, pulse oximetry, re-evaluation of patient's condition, obtaining history from patient or surrogate and review of old charts   (including critical care time)  Medications Ordered in ED Medications  sodium chloride flush (NS) 0.9 % injection 3 mL (has no administration in time range)    ED Course  I have reviewed the triage vital signs and the nursing  notes.  Pertinent labs & imaging results that were available during my care of the patient were reviewed by me and considered in my medical decision making (see chart for details).    MDM Rules/Calculators/A&P                          Patient here with dysarthria, she seems somewhat confused at times.  When I asked the patient what brought her in today she said that she did not know and that she was not feeling short of breath although she appears very winded and is on 6 L via cannula.  She is here with dysarthric speech and hypoxia which is new.  I ordered and reviewed labs which include CBC which shows no significant abnormalities.  Her CMP shows elevated glucose of 160, some renal insufficiency with a creatinine of 1.42.  Her BNP is elevated 872.6 with no history of the same for comparison.  Troponin within normal limits.  Patient has been significantly hypertensive.  I ordered her home meds.  I ordered and reviewed images which included a 2 view chest x-ray which shows evidence of pulmonary edema, CT head images show no acute abnormalities.  Patient work-up currently pending.  Signout given to PA [place].  CTA ordered of the chest to rule out PE. Final Clinical Impression(s) / ED Diagnoses Final diagnoses:  None    Rx / DC Orders ED Discharge Orders    None       Arthor Captain, PA-C 04/05/20 1551    Sabino Donovan, MD 04/06/20 [phone]

## 2020-04-05 NOTE — ED Triage Notes (Addendum)
Patient arrives to ED with [facility] with complaints of acute onset of shortness of breath starting today. Pt states the symptoms started when she woke up. Pt and family member noticed slurred speech as well. LKN 11pm yesterday 11/19. Pt O2 sats found to be in the 80's RA by [facility]. Pt currently on 6L O@ to keep O2 saturation above 94%.

## 2020-04-05 NOTE — ED Provider Notes (Signed)
Medical screening examination/treatment/procedure(s) were conducted as a shared visit with non-physician practitioner(s) and myself.  I personally evaluated the patient during the encounter.  .Critical Care Performed by: Linwood Dibbles, MD Authorized by: Linwood Dibbles, MD   Critical care provider statement:    Critical care time (minutes):  45   Critical care was time spent personally by me on the following activities:  Discussions with consultants, evaluation of patient's response to treatment, examination of patient, ordering and performing treatments and interventions, ordering and review of laboratory studies, ordering and review of radiographic studies, pulse oximetry, re-evaluation of patient's condition, obtaining history from patient or surrogate and review of old charts   Patient presented with shortness of breath and decreased O2 sats.  I suspect her symptoms are related to CHF exacerbation.  Patient does have elevated BNP and some evidence of pulmonary edema.  No PE noted on CT scan.  Covid test is negative.  Patient's ABG did show combination of respiratory acidosis and hypoxemic respiratory failure.  Daughter was at the bedside and we updated on the status.  Patient was started on BiPAP.  Repeat ABG does not show any significant improvement.  Patient is still very somnolent.  Concern of the patient may require intubation as she is not improving with BiPAP.  Discussed this with the daughter.  She is concerned about placing the patient on a ventilator.  There is no severe worsening at this time.  Critical care has been consulted.  Daughter would like to get their opinion before we proceed with intubation.   Linwood Dibbles, MD 04/05/20 [phone]

## 2020-04-05 NOTE — H&P (Signed)
NAME:  Amanda Bauer, MRN:  [id number], DOB:  [birth year], LOS: 0 ADMISSION DATE:  04/05/2020, CONSULTATION DATE:  04/05/2020 REFERRING MD:  Arthor Captain, PA, CHIEF COMPLAINT:  Hypoxic respiratory failure  Brief History   74 year old female with prior history of former smoker, likely OSA not yet on CPAP (awaiting sleep study), CKD stage IIIb, AR, HTN, HLD, hypothyroidism, osteoarthritis, barrett esophagus, and hiatal hernia presenting from home 11/20 with shortness of breath and slurred speech. In ER had progressive confusion, drowsiness and dyspnea and was placed on bipap.  ABG with hypercarbic resp failure, BP remained 220/100. PCCM called  History of present illness    74 year old female with prior history of former smoker, CKD stage IIIb, AR, HTN, HLD, hypothyroidism, osteoarthritis, barrett esophagus, and hiatal hernia presenting from home 11/20 with shortness of breath and slurred speech.  Patient had reported she woke up feeling short of breath, found by EMS hypoxic in the 80's on room air, up to 94 on 6L [place].    On arrival to ER, she was afebrile, hypertensive up to 200/93, NSR, and tachypneic.   Labs noted for glucose 160, sCr 1.42 (previously 1.3 02/2019), normal WBC ABG notable for respiratory acidosis 7.214/82.5/65/33.5  Placed on bipap and PCCM consulted for ICU admission.   Of note was awaiting sleep study for presumed OSA followed by Dr. Wynona Neat   Past Medical History  Former smoker, CKD stage IIIb, AR, HTN, HLD, hypothyroidism, osteoarthritis, barrett esophagus, hiatal hernia  Significant Hospital Events    Consults:   Procedures:   Significant Diagnostic Tests:  11/20 [facility] >> neg  11/20 CTA PE >>1. No evidence of pulmonary embolism. 2. Small right pleural effusion. Minimal patchy hazy density over the lung bases likely atelectasis and less likely infection. Subtle hazy perihilar density which may be due to edema. 3. Mild cardiomegaly. 4. Two liver cysts  unchanged.   Micro Data:  11/20 SARS2/ flu >> neg  Antimicrobials:  n/a   Interim history/subjective:  Seen in ER, on bipap.  Lethargic but arouses to name, protecting airway. Remains hypertensive 220/100.    Objective   Blood pressure (!) 187/89, pulse 69, temperature 97.8 F (36.6 C), temperature source Oral, resp. rate (!) 28, height 5\' 6"  (1.676 m), weight 106.6 kg, SpO2 93 %.    Vent Mode: BIPAP;PCV FiO2 (%):  [40 %-50 %] 50 % Set Rate:  [18 bmp] 18 bmp PEEP:  [6 cmH20-8 cmH20] 6 cmH20  No intake or output data in the 24 hours ending 04/05/20 2032 Filed Weights   04/05/20 1326  Weight: 106.6 kg    Examination: General: chronically ill appearing female, NAD  HENT: mm moist, no JVD, bipap  Lungs: resps even, tachypneic on bipap, few bibasilar crackles  Cardiovascular: s1s2 NSR 60-70 Abdomen: round, soft, non tender  Extremities: warm and dry, no edema  Neuro: lethargic but arouses to voice, follows simple commands but falls quickly back to sleep    Resolved Hospital Problem list    Assessment & Plan:   Acute hypercarbic respiratory failure - multifactorial in setting acute on chronic dCHF with underlying untreated/decompensated OSA c/b hypertensive urgency.  PLAN -  Diuresis as BP and Scr tol  Continue bipap for now  Tenuous - suspect will ultimately need intubation/vent support - discussed with daughter  F/u ABG  BP control   HTN urgency  PLAN -  PRN hydralazine  If no improvement consider cardene gtt  Holding further B blocker given HR  Check  TSH   Acute on chronic dCHF  PLAN -  Diuresis as above  2D echo  BP control   Hx hypothyroidism  PLAN -  Check TSH  Holding home meds for now    AMS - in setting hypercarbia, HTN urgency  PLAN -  Supportive care as above  CT head neg   CKD - baseline Scr ~1.3 PLAN -  F/u chem    Best practice:  Diet: NPO Pain/Anxiety/Delirium protocol (if indicated): n/a VAP protocol (if indicated): n/a DVT  prophylaxis: SQ heparin  GI prophylaxis: n/a Glucose control: SSI Mobility: BR Code Status: full Family Communication: discussed with daughter at length at bedside 11/20 Disposition:  ICU  Labs   CBC: Recent Labs  Lab 04/05/20 1339 04/05/20 1616 04/05/20 1630 04/05/20 1915  WBC 8.4  --   --   --   NEUTROABS 6.8  --   --   --   HGB 13.7 16.0* 17.0* 15.6*  HCT 47.7* 47.0* 50.0* 46.0  MCV 90.0  --   --   --   PLT 261  --   --   --     Basic Metabolic Panel: Recent Labs  Lab 04/05/20 1339 04/05/20 1616 04/05/20 1630 04/05/20 1915  NA 139 141 141 139  K 4.5 4.7 5.4* 4.9  CL 101  --   --   --   CO2 31  --   --   --   GLUCOSE 160*  --   --   --   BUN 20  --   --   --   CREATININE 1.42*  --   --   --   CALCIUM 9.4  --   --   --    GFR: Estimated Creatinine Clearance: 41.6 mL/min (A) (by C-G formula based on SCr of 1.42 mg/dL (H)). Recent Labs  Lab 04/05/20 1339  WBC 8.4    Liver Function Tests: Recent Labs  Lab 04/05/20 1339  AST 14*  ALT 15  ALKPHOS 100  BILITOT 0.4  PROT 7.8  ALBUMIN 3.2*   No results for input(s): LIPASE, AMYLASE in the last 168 hours. Recent Labs  Lab 04/05/20 1509  AMMONIA 35    ABG    Component Value Date/Time   PHART 7.214 (L) 04/05/2020 1915   PCO2ART 82.5 (HH) 04/05/2020 1915   PO2ART 65 (L) 04/05/2020 1915   HCO3 33.5 (H) 04/05/2020 1915   TCO2 36 (H) 04/05/2020 1915   O2SAT 87.0 04/05/2020 1915     Coagulation Profile: Recent Labs  Lab 04/05/20 1339  INR 1.0    Cardiac Enzymes: No results for input(s): CKTOTAL, CKMB, CKMBINDEX, TROPONINI in the last 168 hours.  HbA1C: No results found for: HGBA1C  CBG: Recent Labs  Lab 04/05/20 1347  GLUCAP 137*    Review of Systems:   As per HPI - All other systems reviewed and were neg.    Past Medical History  She,  has a past medical history of Barrett esophagus, Colon polyps, Diverticula of colon, Hemorrhoids, Hiatal hernia, Hyperlipidemia, Hypertension,  Hypothyroidism (02/20/2020), and Osteoarthritis (02/20/2020).   Surgical History    Past Surgical History:  Procedure Laterality Date  . complete hysterectomy       Social History   reports that she has quit smoking. She has never used smokeless tobacco. She reports current alcohol use. She reports that she does not use drugs.   Family History   Her family history includes Cancer in an other family member;  Cerebral aneurysm in an other family member; Hypertension in her mother; Stomach cancer in her mother; Stroke in her father.   Allergies Allergies  Allergen Reactions  . Aspirin Other (See Comments)    Heart Flutters  . Pravastatin Nausea Only  . Tramadol Other (See Comments)    Hallucinations      Home Medications  Prior to Admission medications   Medication Sig Start Date End Date Taking? Authorizing Provider  atenolol (TENORMIN) 50 MG tablet Take 50 mg by mouth daily. 03/29/20  Yes [provider]  atorvastatin (LIPITOR) 10 MG tablet Take 10 mg by mouth daily. 04/03/20  Yes [provider]  carvedilol (COREG) 12.5 MG tablet Take 1 tablet (12.5 mg total) by mouth 2 (two) times daily. 02/27/20 05/27/20 Yes Baldo Daub, MD  cloNIDine (CATAPRES) 0.1 MG tablet Take 0.1 mg by mouth as needed (only take when blood pressure is over 180).    Yes [provider]  levothyroxine (SYNTHROID) 112 MCG tablet Take 112 mcg by mouth daily before breakfast.   Yes [provider]  valsartan (DIOVAN) 80 MG tablet Take 80 mg by mouth daily.  02/13/20  Yes [provider]     Critical care time:      Dirk Dress, NP Pulmonary/Critical Care Medicine  04/05/2020  8:32 PM   Attending section:  Hx from pt's daughter, chart and medical team.  74 yo female with poorly controlled HTN had recent evaluation for sleep disordered breathing.  She presented to ER on 11/20 with dyspnea, slurred speech and altered mental status.  Found to have  acute pulmonary edema with hypertensive emergency, and acute on chronic hypoxic/hypercapnic respiratory failure.  Started on blood pressure therapy and Bipap.  CT head negative.  CT chest consistent with pulmonary edema.  BP (!) 163/83   Pulse 68   Temp 97.8 F (36.6 C) (Oral)   Resp (!) 22   Ht 5\' 6"  (1.676 m)   Wt 106.6 kg   SpO2 95%   BMI 37.93 kg/m   Somnolent.  Opens eyes with stimulation then goes back to sleep.  Moans intermittently.  Bipap mask on.  HR regular.  Decreased BS with b/l crackles.  Abdomen soft, decreased bowel sounds.  No edema.  A/p  Acute on chronic hypoxic/hypercapnic respiratory failure in setting of acute pulmonary edema and presumed OSA/OHS. - goal SpO2 90 to 95% - continue lasix - continue Bipap for now - f/u ABG - monitor need for intubation - f/u CXR  HTN emergency with acute on chronic diastolic CHF. Hx of HLD. - goal SBP 170 to 190 for now, then eventually normotensive  Polycythemia. - likely from chronic hypoxia - f/u CBC  Hx of hypothyroidism. - check TSH  Acute metabolic encephalopathy 2nd to hypoxia/hypercapnia - monitor mental status  CKD 3a. - f/u BMET  Hyperglycemia. - no hx of DM - check HbA1C - SSI  Goals of care. - d/w pt's daughter at bedside - she believes her mother would be agreeable to aggressive therapy as needed - full code  CC time by me independent of APP time 36 minutes  , MD [facility] Pulmonary/Critical Care Pager - [phone] 04/05/2020, 8:41 PM

## 2020-04-05 NOTE — Consult Note (Signed)
NAME:  Terrika Zuver, MRN:  [id number], DOB:  25-Feb-1946, LOS: 0 ADMISSION DATE:  04/05/2020, CONSULTATION DATE:  04/05/2020 REFERRING MD:  Arthor Captain, PA, CHIEF COMPLAINT:  Hypoxic respiratory failure  Brief History   74 year old female with prior history of former smoker, likely OSA not yet on CPAP (awaiting sleep study), CKD stage IIIb, AR, HTN, HLD, hypothyroidism, osteoarthritis, barrett esophagus, and hiatal hernia presenting from home 11/20 with shortness of breath and slurred speech. In ER had progressive confusion, drowsiness and dyspnea and was placed on bipap.  ABG with hypercarbic resp failure, BP remained 220/100. PCCM called  History of present illness    74 year old female with prior history of former smoker, CKD stage IIIb, AR, HTN, HLD, hypothyroidism, osteoarthritis, barrett esophagus, and hiatal hernia presenting from home 11/20 with shortness of breath and slurred speech.  Patient had reported she woke up feeling short of breath, found by EMS hypoxic in the 80's on room air, up to 94 on 6L [place].    On arrival to ER, she was afebrile, hypertensive up to 200/93, NSR, and tachypneic.   Labs noted for glucose 160, sCr 1.42 (previously 1.3 02/2019), normal WBC ABG notable for respiratory acidosis 7.214/82.5/65/33.5  Placed on bipap and PCCM consulted for ICU admission.   Of note was awaiting sleep study for presumed OSA followed by Dr. Wynona Neat   Past Medical History  Former smoker, CKD stage IIIb, AR, HTN, HLD, hypothyroidism, osteoarthritis, barrett esophagus, hiatal hernia  Significant Hospital Events    Consults:   Procedures:   Significant Diagnostic Tests:  11/20 [facility] >> neg  11/20 CTA PE >>1. No evidence of pulmonary embolism. 2. Small right pleural effusion. Minimal patchy hazy density over the lung bases likely atelectasis and less likely infection. Subtle hazy perihilar density which may be due to edema. 3. Mild cardiomegaly. 4. Two liver cysts  unchanged.   Micro Data:  11/20 SARS2/ flu >> neg  Antimicrobials:  n/a   Interim history/subjective:  Seen in ER, on bipap.  Lethargic but arouses to name, protecting airway. Remains hypertensive 220/100.    Objective   Blood pressure (!) 187/89, pulse 69, temperature 97.8 F (36.6 C), temperature source Oral, resp. rate (!) 28, height 5\' 6"  (1.676 m), weight 106.6 kg, SpO2 93 %.    Vent Mode: BIPAP;PCV FiO2 (%):  [40 %-50 %] 50 % Set Rate:  [18 bmp] 18 bmp PEEP:  [6 cmH20-8 cmH20] 6 cmH20  No intake or output data in the 24 hours ending 04/05/20 2026 Filed Weights   04/05/20 1326  Weight: 106.6 kg    Examination: General: chronically ill appearing female, NAD  HENT: mm moist, no JVD, bipap  Lungs: resps even, tachypneic on bipap, few bibasilar crackles  Cardiovascular: s1s2 NSR 60-70 Abdomen: round, soft, non tender  Extremities: warm and dry, no edema  Neuro: lethargic but arouses to voice, follows simple commands but falls quickly back to sleep    Resolved Hospital Problem list    Assessment & Plan:   Acute hypercarbic respiratory failure - multifactorial in setting acute on chronic dCHF with underlying untreated/decompensated OSA c/b hypertensive urgency.  PLAN -  Diuresis as BP and Scr tol  Continue bipap for now  Tenuous - suspect will ultimately need intubation/vent support - discussed with daughter  F/u ABG  BP control   HTN urgency  PLAN -  PRN hydralazine  If no improvement consider cardene gtt  Holding further B blocker given HR  Check  TSH   Acute on chronic dCHF  PLAN -  Diuresis as above  2D echo  BP control   Hx hypothyroidism  PLAN -  Check TSH  Holding home meds for now    AMS - in setting hypercarbia, HTN urgency  PLAN -  Supportive care as above  CT head neg   CKD - baseline Scr ~1.3 PLAN -  F/u chem    Best practice:  Diet: NPO Pain/Anxiety/Delirium protocol (if indicated): n/a VAP protocol (if indicated): n/a DVT  prophylaxis: SQ heparin  GI prophylaxis: n/a Glucose control: SSI Mobility: BR Code Status: full Family Communication: discussed with daughter at length at bedside 11/20 Disposition:  ICU  Labs   CBC: Recent Labs  Lab 04/05/20 1339 04/05/20 1616 04/05/20 1630 04/05/20 1915  WBC 8.4  --   --   --   NEUTROABS 6.8  --   --   --   HGB 13.7 16.0* 17.0* 15.6*  HCT 47.7* 47.0* 50.0* 46.0  MCV 90.0  --   --   --   PLT 261  --   --   --     Basic Metabolic Panel: Recent Labs  Lab 04/05/20 1339 04/05/20 1616 04/05/20 1630 04/05/20 1915  NA 139 141 141 139  K 4.5 4.7 5.4* 4.9  CL 101  --   --   --   CO2 31  --   --   --   GLUCOSE 160*  --   --   --   BUN 20  --   --   --   CREATININE 1.42*  --   --   --   CALCIUM 9.4  --   --   --    GFR: Estimated Creatinine Clearance: 41.6 mL/min (A) (by C-G formula based on SCr of 1.42 mg/dL (H)). Recent Labs  Lab 04/05/20 1339  WBC 8.4    Liver Function Tests: Recent Labs  Lab 04/05/20 1339  AST 14*  ALT 15  ALKPHOS 100  BILITOT 0.4  PROT 7.8  ALBUMIN 3.2*   No results for input(s): LIPASE, AMYLASE in the last 168 hours. Recent Labs  Lab 04/05/20 1509  AMMONIA 35    ABG    Component Value Date/Time   PHART 7.214 (L) 04/05/2020 1915   PCO2ART 82.5 (HH) 04/05/2020 1915   PO2ART 65 (L) 04/05/2020 1915   HCO3 33.5 (H) 04/05/2020 1915   TCO2 36 (H) 04/05/2020 1915   O2SAT 87.0 04/05/2020 1915     Coagulation Profile: Recent Labs  Lab 04/05/20 1339  INR 1.0    Cardiac Enzymes: No results for input(s): CKTOTAL, CKMB, CKMBINDEX, TROPONINI in the last 168 hours.  HbA1C: No results found for: HGBA1C  CBG: Recent Labs  Lab 04/05/20 1347  GLUCAP 137*    Review of Systems:   As per HPI - All other systems reviewed and were neg.    Past Medical History  She,  has a past medical history of Barrett esophagus, Colon polyps, Diverticula of colon, Hemorrhoids, Hiatal hernia, Hyperlipidemia, Hypertension,  Hypothyroidism (02/20/2020), and Osteoarthritis (02/20/2020).   Surgical History    Past Surgical History:  Procedure Laterality Date   complete hysterectomy       Social History   reports that she has quit smoking. She has never used smokeless tobacco. She reports current alcohol use. She reports that she does not use drugs.   Family History   Her family history includes Cancer in an other family member;  Cerebral aneurysm in an other family member; Hypertension in her mother; Stomach cancer in her mother; Stroke in her father.   Allergies Allergies  Allergen Reactions   Aspirin Other (See Comments)    Heart Flutters   Pravastatin Nausea Only   Tramadol Other (See Comments)    Hallucinations      Home Medications  Prior to Admission medications   Medication Sig Start Date End Date Taking? Authorizing Provider  atenolol (TENORMIN) 50 MG tablet Take 50 mg by mouth daily. 03/29/20  Yes [provider]  atorvastatin (LIPITOR) 10 MG tablet Take 10 mg by mouth daily. 04/03/20  Yes [provider]  carvedilol (COREG) 12.5 MG tablet Take 1 tablet (12.5 mg total) by mouth 2 (two) times daily. 02/27/20 05/27/20 Yes Baldo Daub, MD  cloNIDine (CATAPRES) 0.1 MG tablet Take 0.1 mg by mouth as needed (only take when blood pressure is over 180).    Yes [provider]  levothyroxine (SYNTHROID) 112 MCG tablet Take 112 mcg by mouth daily before breakfast.   Yes [provider]  valsartan (DIOVAN) 80 MG tablet Take 80 mg by mouth daily.  02/13/20  Yes [provider]     Critical care time:      Dirk Dress, NP Pulmonary/Critical Care Medicine  04/05/2020  8:26 PM

## 2020-04-06 ENCOUNTER — Inpatient Hospital Stay (HOSPITAL_COMMUNITY): Payer: Medicare PPO

## 2020-04-06 DIAGNOSIS — J9602 Acute respiratory failure with hypercapnia: Secondary | ICD-10-CM | POA: Diagnosis not present

## 2020-04-06 DIAGNOSIS — J9601 Acute respiratory failure with hypoxia: Secondary | ICD-10-CM

## 2020-04-06 LAB — CBC
HCT: 45.3 % (ref 36.0–46.0)
Hemoglobin: 13.2 g/dL (ref 12.0–15.0)
MCH: 25.8 pg — ABNORMAL LOW (ref 26.0–34.0)
MCHC: 29.1 g/dL — ABNORMAL LOW (ref 30.0–36.0)
MCV: 88.6 fL (ref 80.0–100.0)
Platelets: 258 10*3/uL (ref 150–400)
RBC: 5.11 MIL/uL (ref 3.87–5.11)
RDW: 16.5 % — ABNORMAL HIGH (ref 11.5–15.5)
WBC: 10.9 10*3/uL — ABNORMAL HIGH (ref 4.0–10.5)
nRBC: 2.8 % — ABNORMAL HIGH (ref 0.0–0.2)

## 2020-04-06 LAB — PHOSPHORUS: Phosphorus: 5 mg/dL — ABNORMAL HIGH (ref 2.5–4.6)

## 2020-04-06 LAB — MRSA PCR SCREENING: MRSA by PCR: NEGATIVE

## 2020-04-06 LAB — BASIC METABOLIC PANEL
Anion gap: 7 (ref 5–15)
BUN: 20 mg/dL (ref 8–23)
CO2: 27 mmol/L (ref 22–32)
Calcium: 9.3 mg/dL (ref 8.9–10.3)
Chloride: 104 mmol/L (ref 98–111)
Creatinine, Ser: 1.55 mg/dL — ABNORMAL HIGH (ref 0.44–1.00)
GFR, Estimated: 35 mL/min — ABNORMAL LOW (ref >60)
Glucose, Bld: 105 mg/dL — ABNORMAL HIGH (ref 70–99)
Potassium: 5.6 mmol/L — ABNORMAL HIGH (ref 3.5–5.1)
Sodium: 138 mmol/L (ref 135–145)

## 2020-04-06 LAB — GLUCOSE, CAPILLARY
Glucose-Capillary: 98 mg/dL (ref 70–99)
Glucose-Capillary: 104 mg/dL — ABNORMAL HIGH (ref 70–99)
Glucose-Capillary: 90 mg/dL (ref 70–99)
Glucose-Capillary: 80 mg/dL (ref 70–99)
Glucose-Capillary: 155 mg/dL — ABNORMAL HIGH (ref 70–99)

## 2020-04-06 LAB — ECHOCARDIOGRAM COMPLETE
Area-P 1/2: 2.17 cm2
Height: 66 in
P 1/2 time: 641 msec
S' Lateral: 2.6 cm
Weight: 3760.17 oz

## 2020-04-06 LAB — POCT I-STAT 7, (LYTES, BLD GAS, ICA,H+H)
Acid-Base Excess: 3 mmol/L — ABNORMAL HIGH (ref 0.0–2.0)
Bicarbonate: 30.5 mmol/L — ABNORMAL HIGH (ref 20.0–28.0)
Calcium, Ion: 1.34 mmol/L (ref 1.15–1.40)
HCT: 40 % (ref 36.0–46.0)
Hemoglobin: 13.6 g/dL (ref 12.0–15.0)
O2 Saturation: 93 %
Potassium: 4 mmol/L (ref 3.5–5.1)
Sodium: 140 mmol/L (ref 135–145)
TCO2: 32 mmol/L (ref 22–32)
pCO2 arterial: 59.3 mmHg — ABNORMAL HIGH (ref 32.0–48.0)
pH, Arterial: 7.319 — ABNORMAL LOW (ref 7.350–7.450)
pO2, Arterial: 73 mmHg — ABNORMAL LOW (ref 83.0–108.0)

## 2020-04-06 LAB — HEMOGLOBIN A1C
Hgb A1c MFr Bld: 6.2 % — ABNORMAL HIGH (ref 4.8–5.6)
Mean Plasma Glucose: 131.24 mg/dL

## 2020-04-06 LAB — MAGNESIUM: Magnesium: 2 mg/dL (ref 1.7–2.4)

## 2020-04-06 LAB — POTASSIUM: Potassium: 4.2 mmol/L (ref 3.5–5.1)

## 2020-04-06 LAB — PROCALCITONIN: Procalcitonin: <0.1 ng/mL

## 2020-04-06 MED ORDER — FUROSEMIDE 10 MG/ML IJ SOLN
40.0000 mg | Freq: Three times a day (TID) | INTRAMUSCULAR | Status: AC
  Administered 2020-04-06 (×2): 40 mg via INTRAVENOUS
  Filled 2020-04-06 (×2): qty 4

## 2020-04-06 MED ORDER — SODIUM ZIRCONIUM CYCLOSILICATE 10 G PO PACK
10.0000 g | PACK | Freq: Once | ORAL | Status: AC
  Administered 2020-04-06: 10 g via ORAL
  Filled 2020-04-06: qty 1

## 2020-04-06 NOTE — Progress Notes (Signed)
RT placed pt on Servo I in PCV/BIPAP mode 21/6, 60%, BUR 20. Pt respiratory status is stable w/no distress noted at this time. RT will continue to monitor.

## 2020-04-06 NOTE — Progress Notes (Signed)
Echocardiogram 2D Echocardiogram has been performed.  Amanda Bauer 04/06/2020, 11:51 AM

## 2020-04-06 NOTE — Progress Notes (Signed)
eLink Physician-Brief Progress Note Patient Name: Amanda Bauer DOB: 12-14-1945 MRN: [id number]   Date of Service  04/06/2020  HPI/Events of Note  Urinary retention - Bladder scan with 440 mL.  eICU Interventions  Plan: 1. I/O cath PRN.      Intervention Category Major Interventions: Other:  Lenell Antu 04/06/2020, 10:38 PM

## 2020-04-06 NOTE — Progress Notes (Signed)
eLink Physician-Brief Progress Note Patient Name: Amanda Bauer DOB: 1946/05/15 MRN: [id number]   Date of Service  04/06/2020  HPI/Events of Note  Nursing reports increasing somnolence.  eICU Interventions  Plan: 1. ABG STAT. 2. Ground team notified of ALOC.     Intervention Category Major Interventions: Change in mental status - evaluation and management  Jaymin Waln Eugene 04/06/2020, 6:56 AM

## 2020-04-06 NOTE — H&P (Addendum)
NAME:  Amanda Bauer, MRN:  [id number], DOB:  21-May-1945, LOS: 1 ADMISSION DATE:  04/05/2020, CONSULTATION DATE:  04/05/2020 REFERRING MD:  Arthor Captain, PA, CHIEF COMPLAINT:  Hypoxic respiratory failure  Brief History   74 year old female with prior history of former smoker, likely OSA not yet on CPAP (awaiting sleep study), CKD stage IIIb, AR, HTN, HLD, hypothyroidism, osteoarthritis, barrett esophagus, and hiatal hernia presenting from home 11/20 with shortness of breath and slurred speech. In ER had progressive confusion, drowsiness and dyspnea and was placed on bipap.  ABG with hypercarbic resp failure, BP remained 220/100. PCCM called for admit.   History of present illness    74 year old female with prior history of former smoker, CKD stage IIIb, AR, HTN, HLD, hypothyroidism, osteoarthritis, barrett esophagus, and hiatal hernia presenting from home 11/20 with shortness of breath and slurred speech.  Patient had reported she woke up feeling short of breath, found by EMS hypoxic in the 80's on room air, up to 94 on 6L [place].    On arrival to ER, she was afebrile, hypertensive up to 200/93, NSR, and tachypneic. Labs noted for glucose 160, sCr 1.42 (previously 1.3 02/2019), normal WBC.  ABG notable for respiratory acidosis 7.214/82.5/65/33.5  CXR consistent with pulmonary edema and cardiomegaly. Placed on bipap and PCCM consulted for ICU admission. Of note was awaiting sleep study for presumed OSA followed by Dr. Wynona Neat   Past Medical History  Former smoker, CKD stage IIIb, AR, HTN, HLD, hypothyroidism, osteoarthritis, barrett esophagus, hiatal hernia  Significant Hospital Events   11/21 Admit   Consults:   Procedures:   Significant Diagnostic Tests:  11/20 [facility] >> neg  11/20 CTA PE >>1. No evidence of pulmonary embolism. 2. Small right pleural effusion. Minimal patchy hazy density over the lung bases likely atelectasis and less likely infection. Subtle hazy perihilar density which may  be due to edema. 3. Mild cardiomegaly. 4. Two liver cysts unchanged.  Micro Data:  11/20 SARS2/ flu >> neg 11/20 MRSA PCR >> neg  Antimicrobials:  n/a   Interim history/subjective:  RN reports mental status waxing/ waning overnight Remains on BiPAP BP better controlled Diuresed 1650 ml tmax 100.3, WBC 8.4-> 10.9  Objective   Blood pressure 140/69, pulse 66, temperature 100.3 F (37.9 C), temperature source Axillary, resp. rate (!) 28, height 5\' 6"  (1.676 m), weight 106.6 kg, SpO2 99 %.    Vent Mode: PCV;BIPAP FiO2 (%):  [40 %-80 %] 80 % Set Rate:  [18 bmp-20 bmp] 20 bmp PEEP:  [6 cmH20-8 cmH20] 6 cmH20   Intake/Output Summary (Last 24 hours) at 04/06/2020 0741 Last data filed at 04/06/2020 0700 Gross per 24 hour  Intake 0 ml  Output 1650 ml  Net -1650 ml   Filed Weights   04/05/20 1326 04/06/20 0500  Weight: 106.6 kg 106.6 kg   Examination: General:  Older female resting on BiPAP in NAD HEENT: pupils 3/reactive, anicteric, full fitting face mask  Neuro: Sleepy but awakens easily to voice, oriented x 3, f/c, MAE  CV: RR, NSR PULM:  Non labored on BiPAP, clear anteriorly, slight bibasilar rales GI: obese, soft, bs active, ND/ NT, purwick Extremities: warm/dry, no LE edema  Skin: no rashes   CXR c/w with worsening pulmonary edema, can not rule out underlying infiltrate   Resolved Hospital Problem list    Assessment & Plan:   Acute hypercarbic respiratory failure - multifactorial in setting acute on chronic dCHF, pulmonary edema,underlying untreated/decompensated OSA c/b hypertensive urgency.  PLAN -  Continue BiPAP, hopefully can trial off later this morning Wean FiO2 for sat goal > 92% Remains high intubation risk, tenuous airway Continue bipap for now  Prn ABG  Additional lasix as below   HTN urgency  PLAN -  PRN hydralazine  Continue coreg 12.5 mg BID TSH 2.558  Acute on chronic dCHF  PLAN -  Lasix 40 mg x 2 2D echo ordered  BP control   Hx  hypothyroidism  PLAN -  TSH 2.558 Continue home levothyroxine    AMS - in setting hypercarbia, HTN urgency  PLAN -  Supportive care as above  Ongoing neuro exams    CKD - baseline Scr ~1.3 PLAN -  Strict I/Os Trend BMET  Mild hyperkalemia - no EKG changes P:  lokelmia x 1 Lasix as above will help as well Recheck K this afternoon   Leukocytosis, temp 100.4 - UA neg 11/21.  CXR 11/21 worse, likely just pulmonary edema, but will need to monitor for possible secondary infectious etiology  P:  Send blood cultures Check PCT Hold on abx for now Trend WBC/ fever curve CXR in am   Best practice:  Diet: NPO Pain/Anxiety/Delirium protocol (if indicated): n/a VAP protocol (if indicated): n/a DVT prophylaxis: SQ heparin  GI prophylaxis: n/a Glucose control: SSI Mobility: BR Code Status: full Family Communication: no family at bedside.  Pending.  Disposition:  ICU  Labs   CBC: Recent Labs  Lab 04/05/20 1339 04/05/20 1339 04/05/20 1616 04/05/20 1630 04/05/20 1915 04/05/20 2045 04/06/20 0111  WBC 8.4  --   --   --   --   --  10.9*  NEUTROABS 6.8  --   --   --   --   --   --   HGB 13.7   < > 16.0* 17.0* 15.6* 15.6* 13.2  HCT 47.7*   < > 47.0* 50.0* 46.0 46.0 45.3  MCV 90.0  --   --   --   --   --  88.6  PLT 261  --   --   --   --   --  258   < > = values in this interval not displayed.    Basic Metabolic Panel: Recent Labs  Lab 04/05/20 1339 04/05/20 1339 04/05/20 1616 04/05/20 1630 04/05/20 1915 04/05/20 2045 04/06/20 0342  NA 139   < > 141 141 139 140 138  K 4.5   < > 4.7 5.4* 4.9 5.0 5.6*  CL 101  --   --   --   --   --  104  CO2 31  --   --   --   --   --  27  GLUCOSE 160*  --   --   --   --   --  105*  BUN 20  --   --   --   --   --  20  CREATININE 1.42*  --   --   --   --   --  1.55*  CALCIUM 9.4  --   --   --   --   --  9.3  MG  --   --   --   --   --   --  2.0  PHOS  --   --   --   --   --   --  5.0*   < > = values in this interval not  displayed.   GFR: Estimated Creatinine Clearance: 38.1  mL/min (A) (by C-G formula based on SCr of 1.55 mg/dL (H)). Recent Labs  Lab 04/05/20 1339 04/06/20 0111  WBC 8.4 10.9*    Liver Function Tests: Recent Labs  Lab 04/05/20 1339  AST 14*  ALT 15  ALKPHOS 100  BILITOT 0.4  PROT 7.8  ALBUMIN 3.2*   No results for input(s): LIPASE, AMYLASE in the last 168 hours. Recent Labs  Lab 04/05/20 1509  AMMONIA 35    ABG    Component Value Date/Time   PHART 7.264 (L) 04/05/2020 2045   PCO2ART 69.0 (HH) 04/05/2020 2045   PO2ART 101 04/05/2020 2045   HCO3 31.2 (H) 04/05/2020 2045   TCO2 33 (H) 04/05/2020 2045   O2SAT 96.0 04/05/2020 2045     Coagulation Profile: Recent Labs  Lab 04/05/20 1339  INR 1.0    Cardiac Enzymes: No results for input(s): CKTOTAL, CKMB, CKMBINDEX, TROPONINI in the last 168 hours.  HbA1C: Hgb A1c MFr Bld  Date/Time Value Ref Range Status  04/06/2020 01:11 AM 6.2 (H) 4.8 - 5.6 % Final    Comment:    (NOTE) Pre diabetes:          5.7%-6.4%  Diabetes:              >6.4%  Glycemic control for   <7.0% adults with diabetes     CBG: Recent Labs  Lab 04/05/20 1347 04/05/20 2052 04/05/20 2249 04/06/20 0415  GLUCAP 137* 110* 103* 98     Critical care time: 30 mins     Posey Boyer, ACNP [facility] 04/06/2020, 7:41 AM  See Loretha Stapler for personal pager PCCM on call pager [phone]

## 2020-04-07 ENCOUNTER — Inpatient Hospital Stay (HOSPITAL_COMMUNITY): Payer: Medicare PPO

## 2020-04-07 ENCOUNTER — Encounter (HOSPITAL_COMMUNITY): Payer: Self-pay | Admitting: Pulmonary Disease

## 2020-04-07 DIAGNOSIS — J9601 Acute respiratory failure with hypoxia: Secondary | ICD-10-CM | POA: Diagnosis not present

## 2020-04-07 DIAGNOSIS — J9602 Acute respiratory failure with hypercapnia: Secondary | ICD-10-CM | POA: Diagnosis not present

## 2020-04-07 LAB — RENAL FUNCTION PANEL
Albumin: 2.6 g/dL — ABNORMAL LOW (ref 3.5–5.0)
Anion gap: 8 (ref 5–15)
BUN: 39 mg/dL — ABNORMAL HIGH (ref 8–23)
CO2: 29 mmol/L (ref 22–32)
Calcium: 9 mg/dL (ref 8.9–10.3)
Chloride: 100 mmol/L (ref 98–111)
Creatinine, Ser: 2.94 mg/dL — ABNORMAL HIGH (ref 0.44–1.00)
GFR, Estimated: 16 mL/min — ABNORMAL LOW (ref >60)
Glucose, Bld: 100 mg/dL — ABNORMAL HIGH (ref 70–99)
Phosphorus: 4.9 mg/dL — ABNORMAL HIGH (ref 2.5–4.6)
Potassium: 4.1 mmol/L (ref 3.5–5.1)
Sodium: 137 mmol/L (ref 135–145)

## 2020-04-07 LAB — CBC
HCT: 38.8 % (ref 36.0–46.0)
Hemoglobin: 11.8 g/dL — ABNORMAL LOW (ref 12.0–15.0)
MCH: 26.8 pg (ref 26.0–34.0)
MCHC: 30.4 g/dL (ref 30.0–36.0)
MCV: 88.2 fL (ref 80.0–100.0)
Platelets: 224 10*3/uL (ref 150–400)
RBC: 4.4 MIL/uL (ref 3.87–5.11)
RDW: 16.5 % — ABNORMAL HIGH (ref 11.5–15.5)
WBC: 7.9 10*3/uL (ref 4.0–10.5)
nRBC: 1.1 % — ABNORMAL HIGH (ref 0.0–0.2)

## 2020-04-07 LAB — PATHOLOGIST SMEAR REVIEW

## 2020-04-07 LAB — PROCALCITONIN: Procalcitonin: <0.1 ng/mL

## 2020-04-07 LAB — GLUCOSE, CAPILLARY
Glucose-Capillary: 105 mg/dL — ABNORMAL HIGH (ref 70–99)
Glucose-Capillary: 121 mg/dL — ABNORMAL HIGH (ref 70–99)
Glucose-Capillary: 88 mg/dL (ref 70–99)
Glucose-Capillary: 92 mg/dL (ref 70–99)
Glucose-Capillary: 96 mg/dL (ref 70–99)
Glucose-Capillary: 98 mg/dL (ref 70–99)

## 2020-04-07 LAB — MAGNESIUM: Magnesium: 1.9 mg/dL (ref 1.7–2.4)

## 2020-04-07 MED ORDER — FUROSEMIDE 10 MG/ML IJ SOLN
40.0000 mg | Freq: Two times a day (BID) | INTRAMUSCULAR | Status: AC
  Administered 2020-04-07 (×2): 40 mg via INTRAVENOUS
  Filled 2020-04-07 (×2): qty 4

## 2020-04-07 NOTE — Plan of Care (Signed)

## 2020-04-07 NOTE — H&P (Signed)
NAME:  Amanda Bauer, MRN:  [id number], DOB:  [birth year], LOS: 2 ADMISSION DATE:  04/05/2020, CONSULTATION DATE:  04/05/2020 REFERRING MD:  Arthor Captain, PA, CHIEF COMPLAINT:  Hypoxic respiratory failure  Brief History   74 year old female with prior history of former smoker, likely OSA not yet on CPAP (awaiting sleep study), CKD stage IIIb, AR, HTN, HLD, hypothyroidism, osteoarthritis, barrett esophagus, and hiatal hernia presenting from home 11/20 with shortness of breath and slurred speech. In ER had progressive confusion, drowsiness and dyspnea and was placed on bipap.  ABG with hypercarbic resp failure, BP remained 220/100. PCCM called for admit.   History of present illness    74 year old female with prior history of former smoker, CKD stage IIIb, AR, HTN, HLD, hypothyroidism, osteoarthritis, barrett esophagus, and hiatal hernia presenting from home 11/20 with shortness of breath and slurred speech.  Patient had reported she woke up feeling short of breath, found by EMS hypoxic in the 80's on room air, up to 94 on 6L [place].    On arrival to ER, she was afebrile, hypertensive up to 200/93, NSR, and tachypneic. Labs noted for glucose 160, sCr 1.42 (previously 1.3 02/2019), normal WBC.  ABG notable for respiratory acidosis 7.214/82.5/65/33.5  CXR consistent with pulmonary edema and cardiomegaly. Placed on bipap and PCCM consulted for ICU admission. Of note was awaiting sleep study for presumed OSA followed by Dr. Wynona Neat   Past Medical History  Former smoker, CKD stage IIIb, AR, HTN, HLD, hypothyroidism, osteoarthritis, barrett esophagus, hiatal hernia  Significant Hospital Events   11/21 Admit   Consults:   Procedures:   Significant Diagnostic Tests:  11/20 [facility] >> neg  11/20 CTA PE >>1. No evidence of pulmonary embolism. 2. Small right pleural effusion. Minimal patchy hazy density over the lung bases likely atelectasis and less likely infection. Subtle hazy perihilar density which may  be due to edema. 3. Mild cardiomegaly. 4. Two liver cysts unchanged.  Micro Data:  11/20 SARS2/ flu >> neg 11/20 MRSA PCR >> neg  Antimicrobials:  n/a   Interim history/subjective:  Diuresed overnight now off BIPAP.   Objective   Blood pressure 114/70, pulse (!) 54, temperature 99.2 F (37.3 C), temperature source Axillary, resp. rate 19, height 5\' 6"  (1.676 m), weight 107.2 kg, SpO2 92 %.    Vent Mode: BIPAP;PCV FiO2 (%):  [60 %] 60 % Set Rate:  [20 bmp] 20 bmp PEEP:  [6 cmH20] 6 cmH20 Plateau Pressure:  [11 cmH20] 11 cmH20   Intake/Output Summary (Last 24 hours) at 04/07/2020 0930 Last data filed at 04/06/2020 2300 Gross per 24 hour  Intake 240 ml  Output 650 ml  Net -410 ml   Filed Weights   04/05/20 1326 04/06/20 0500 04/07/20 0500  Weight: 106.6 kg 106.6 kg 107.2 kg   Examination: General:  Older female resting on BiPAP in NAD HEENT: pupils 3/reactive, anicteric, full fitting face mask  Neuro: Sleepy but awakens easily to voice, oriented x 3, f/c, MAE  CV: RR, NSR PULM:  Non labored on BiPAP, clear anteriorly, slight bibasilar rales GI: obese, soft, bs active, ND/ NT, purwick Extremities: warm/dry, no LE edema  Skin: no rashes   CXR c/w with worsening pulmonary edema, can not rule out underlying infiltrate   Resolved Hospital Problem list    Assessment & Plan:   Acute hypercarbic respiratory failure - multifactorial in setting acute on chronic dCHF, pulmonary edema,underlying untreated/decompensated OSA c/b hypertensive urgency.  - Ambulate  - Incentive spirometry  -  Nocturnal CPAP   HTN urgency - now improved, likely due to volume overload -Continue coreg 12.5 mg BID  Acute on chronic HFpEF  - diurese again today - follow BNP.  CKD - baseline Scr ~1.3 - follow.     Best practice:  Diet: advance diet Pain/Anxiety/Delirium protocol (if indicated): n/a VAP protocol (if indicated): n/a DVT prophylaxis: SQ heparin  GI prophylaxis:  n/a Glucose control: SSI Mobility: BR Code Status: full Family Communication: no family at bedside.  Pending.  Disposition:  ICU  Labs   CBC: Recent Labs  Lab 04/05/20 1339 04/05/20 1616 04/05/20 1915 04/05/20 2045 04/06/20 0111 04/06/20 1750 04/07/20 0054  WBC 8.4  --   --   --  10.9*  --  7.9  NEUTROABS 6.8  --   --   --   --   --   --   HGB 13.7   < > 15.6* 15.6* 13.2 13.6 11.8*  HCT 47.7*   < > 46.0 46.0 45.3 40.0 38.8  MCV 90.0  --   --   --  88.6  --  88.2  PLT 261  --   --   --  258  --  224   < > = values in this interval not displayed.    Basic Metabolic Panel: Recent Labs  Lab 04/05/20 1339 04/05/20 1616 04/05/20 1915 04/05/20 1915 04/05/20 2045 04/06/20 0342 04/06/20 1628 04/06/20 1750 04/07/20 0054  NA 139   < > 139  --  140 138  --  140 137  K 4.5   < > 4.9   < > 5.0 5.6* 4.2 4.0 4.1  CL 101  --   --   --   --  104  --   --  100  CO2 31  --   --   --   --  27  --   --  29  GLUCOSE 160*  --   --   --   --  105*  --   --  100*  BUN 20  --   --   --   --  20  --   --  39*  CREATININE 1.42*  --   --   --   --  1.55*  --   --  2.94*  CALCIUM 9.4  --   --   --   --  9.3  --   --  9.0  MG  --   --   --   --   --  2.0  --   --  1.9  PHOS  --   --   --   --   --  5.0*  --   --  4.9*   < > = values in this interval not displayed.   GFR: Estimated Creatinine Clearance: 20.2 mL/min (A) (by C-G formula based on SCr of 2.94 mg/dL (H)). Recent Labs  Lab 04/05/20 1339 04/06/20 0111 04/06/20 0903 04/07/20 0054  PROCALCITON  --   --  <0.10 <0.10  WBC 8.4 10.9*  --  7.9    Liver Function Tests: Recent Labs  Lab 04/05/20 1339 04/07/20 0054  AST 14*  --   ALT 15  --   ALKPHOS 100  --   BILITOT 0.4  --   PROT 7.8  --   ALBUMIN 3.2* 2.6*   No results for input(s): LIPASE, AMYLASE in the last 168 hours. Recent Labs  Lab 04/05/20 1509  AMMONIA 35  ABG    Component Value Date/Time   PHART 7.319 (L) 04/06/2020 1750   PCO2ART 59.3 (H)  04/06/2020 1750   PO2ART 73 (L) 04/06/2020 1750   HCO3 30.5 (H) 04/06/2020 1750   TCO2 32 04/06/2020 1750   O2SAT 93.0 04/06/2020 1750     Coagulation Profile: Recent Labs  Lab 04/05/20 1339  INR 1.0    Cardiac Enzymes: No results for input(s): CKTOTAL, CKMB, CKMBINDEX, TROPONINI in the last 168 hours.  HbA1C: Hgb A1c MFr Bld  Date/Time Value Ref Range Status  04/06/2020 01:11 AM 6.2 (H) 4.8 - 5.6 % Final    Comment:    (NOTE) Pre diabetes:          5.7%-6.4%  Diabetes:              >6.4%  Glycemic control for   <7.0% adults with diabetes     CBG: Recent Labs  Lab 04/06/20 1521 04/06/20 1943 04/07/20 0008 04/07/20 0407 04/07/20 0636  GLUCAP 80 155* 105* 98 96    CXR: 11/22 - improving bilateral infiltrates and effusions. Persistent retrocardiac infiltrate. (personal review)   35 min spent including > 50% in counseling and coordination of care.   Lynnell Catalan, MD [facility] ICU Physician [facility] Critical Care  Pager: [phone] Or Epic Secure Chat After hours: [phone].  04/07/2020, 9:40 AM

## 2020-04-08 DIAGNOSIS — Z6837 Body mass index (BMI) 37.0-37.9, adult: Secondary | ICD-10-CM

## 2020-04-08 DIAGNOSIS — N1832 Chronic kidney disease, stage 3b: Secondary | ICD-10-CM | POA: Diagnosis not present

## 2020-04-08 DIAGNOSIS — N179 Acute kidney failure, unspecified: Secondary | ICD-10-CM

## 2020-04-08 DIAGNOSIS — E669 Obesity, unspecified: Secondary | ICD-10-CM

## 2020-04-08 DIAGNOSIS — J9601 Acute respiratory failure with hypoxia: Secondary | ICD-10-CM | POA: Diagnosis not present

## 2020-04-08 LAB — GLUCOSE, CAPILLARY
Glucose-Capillary: 101 mg/dL — ABNORMAL HIGH (ref 70–99)
Glucose-Capillary: 107 mg/dL — ABNORMAL HIGH (ref 70–99)
Glucose-Capillary: 111 mg/dL — ABNORMAL HIGH (ref 70–99)
Glucose-Capillary: 113 mg/dL — ABNORMAL HIGH (ref 70–99)
Glucose-Capillary: 120 mg/dL — ABNORMAL HIGH (ref 70–99)
Glucose-Capillary: 123 mg/dL — ABNORMAL HIGH (ref 70–99)
Glucose-Capillary: 138 mg/dL — ABNORMAL HIGH (ref 70–99)

## 2020-04-08 LAB — BASIC METABOLIC PANEL
Anion gap: 14 (ref 5–15)
BUN: 51 mg/dL — ABNORMAL HIGH (ref 8–23)
CO2: 24 mmol/L (ref 22–32)
Calcium: 9.4 mg/dL (ref 8.9–10.3)
Chloride: 99 mmol/L (ref 98–111)
Creatinine, Ser: 2.5 mg/dL — ABNORMAL HIGH (ref 0.44–1.00)
GFR, Estimated: 19 mL/min — ABNORMAL LOW (ref >60)
Glucose, Bld: 120 mg/dL — ABNORMAL HIGH (ref 70–99)
Potassium: 4.6 mmol/L (ref 3.5–5.1)
Sodium: 137 mmol/L (ref 135–145)

## 2020-04-08 LAB — BRAIN NATRIURETIC PEPTIDE: B Natriuretic Peptide: 65.1 pg/mL (ref 0.0–100.0)

## 2020-04-08 MED ORDER — INSULIN ASPART 100 UNIT/ML ~~LOC~~ SOLN: 0.0000 [IU] | Freq: Three times a day (TID) | SUBCUTANEOUS | Status: DC

## 2020-04-08 MED ORDER — INSULIN ASPART 100 UNIT/ML ~~LOC~~ SOLN: 0.0000 [IU] | Freq: Every day | SUBCUTANEOUS | Status: DC

## 2020-04-08 MED ORDER — LORATADINE 10 MG PO TABS: 10.0000 mg | ORAL_TABLET | Freq: Every day | ORAL | Status: DC | PRN

## 2020-04-08 MED ORDER — FUROSEMIDE 10 MG/ML IJ SOLN
40.0000 mg | Freq: Every day | INTRAMUSCULAR | Status: AC
  Administered 2020-04-08: 40 mg via INTRAVENOUS
  Filled 2020-04-08: qty 4

## 2020-04-08 MED ORDER — LORATADINE 10 MG PO TABS
10.0000 mg | ORAL_TABLET | ORAL | Status: DC | PRN
  Administered 2020-04-08 – 2020-04-10 (×2): 10 mg via ORAL
  Filled 2020-04-08 (×2): qty 1

## 2020-04-08 NOTE — Evaluation (Signed)
Physical Therapy Evaluation Patient Details Name: Amanda Bauer MRN: [id number] DOB: 1945-11-18 Today's Date: 04/08/2020   History of Present Illness  74 year old female with prior history of former smoker, likely OSA not yet on CPAP (awaiting sleep study), CKD stage IIIb, AR, HTN, HLD, hypothyroidism, osteoarthritis, barrett esophagus, and hiatal hernia presenting from home 11/20 with shortness of breath and slurred speech. In ER had progressive confusion, drowsiness and dyspnea and was placed on bipap.  ABG with hypercarbic resp failure, BP remained 220/100.   Clinical Impression  Pt admitted with above diagnosis. Pt was able to ambulate in the room with min to min guard assist.  Pt does desat if O2 removed needing 4L to maintain sats >90%.  Will follow acutely and should progress well.  Pt currently with functional limitations due to the deficits listed below (see PT Problem List). Pt will benefit from skilled PT to increase their independence and safety with mobility to allow discharge to the venue listed below.    Follow Up Recommendations Home health PT;Supervision - Intermittent    Equipment Recommendations  None recommended by PT    Recommendations for Other Services       Precautions / Restrictions Precautions Precautions: Fall Restrictions Weight Bearing Restrictions: No      Mobility  Bed Mobility Overal bed mobility: Independent                  Transfers Overall transfer level: Independent                  Ambulation/Gait Ambulation/Gait assistance: Supervision;Min guard Gait Distance (Feet): 90 Feet Assistive device: None Gait Pattern/deviations: Step-through pattern;Decreased stride length   Gait velocity interpretation: <1.31 ft/sec, indicative of household ambulator General Gait Details: Pt ambulated in room without device with good balance overall.  Pt did desat to 86%on RA therefore placed on 4LO2 to maintain sats at >90%  Stairs             Wheelchair Mobility    Modified Rankin (Stroke Patients Only)       Balance                                             Pertinent Vitals/Pain Pain Assessment: No/denies pain    Home Living Family/patient expects to be discharged to:: Private residence Living Arrangements: Spouse/significant other Available Help at Discharge: Family;Available 24 hours/day Type of Home: House Home Access: Level entry     Home Layout: One level Home Equipment: None      Prior Function Level of Independence: Independent         Comments: Drives, Independent with ADLS, pt does cooking and cleaning     Hand Dominance   Dominant Hand: Left    Extremity/Trunk Assessment   Upper Extremity Assessment Upper Extremity Assessment: Defer to OT evaluation    Lower Extremity Assessment Lower Extremity Assessment: Generalized weakness    Cervical / Trunk Assessment Cervical / Trunk Assessment: Normal  Communication   Communication: No difficulties  Cognition Arousal/Alertness: Awake/alert Behavior During Therapy: WFL for tasks assessed/performed Overall Cognitive Status: Within Functional Limits for tasks assessed                                        General Comments General comments (  skin integrity, edema, etc.): 67 bpm, 94% on 4LO2, 126/66 were initial vitals    Exercises     Assessment/Plan    PT Assessment Patient needs continued PT services  PT Problem List Decreased activity tolerance;Decreased balance;Decreased mobility       PT Treatment Interventions DME instruction;Gait training;Functional mobility training;Therapeutic activities;Therapeutic exercise;Balance training;Patient/family education    PT Goals (Current goals can be found in the Care Plan section)  Acute Rehab PT Goals Patient Stated Goal: to go home PT Goal Formulation: With patient Time For Goal Achievement: 04/22/20 Potential to Achieve Goals: Good     Frequency Min 3X/week   Barriers to discharge        Co-evaluation               AM-PAC PT "6 Clicks" Mobility  Outcome Measure Help needed turning from your back to your side while in a flat bed without using bedrails?: None Help needed moving from lying on your back to sitting on the side of a flat bed without using bedrails?: None Help needed moving to and from a bed to a chair (including a wheelchair)?: None Help needed standing up from a chair using your arms (e.g., wheelchair or bedside chair)?: A Little Help needed to walk in hospital room?: A Little Help needed climbing 3-5 steps with a railing? : A Little 6 Click Score: 21    End of Session Equipment Utilized During Treatment: Gait belt;Oxygen Activity Tolerance: Patient tolerated treatment well Patient left: in bed;with call bell/phone within reach Nurse Communication: Mobility status PT Visit Diagnosis: Muscle weakness (generalized) (M62.81)    Time: 6644-0347 PT Time Calculation (min) (ACUTE ONLY): 20 min   Charges:   PT Evaluation $PT Eval Moderate Complexity: 1 Mod          Koi Zangara W,PT Acute Rehabilitation Services Pager:  [phone]  Office:  [phone]    Berline Lopes 04/08/2020, 2:40 PM

## 2020-04-08 NOTE — Progress Notes (Signed)
°   04/08/20 1200  PT Recommendation  Follow Up Recommendations Home health PT;Supervision - Intermittent  PT equipment None recommended by PT  Evaluation completed.  Full note to follow. Pt should progress well.  Did need O2 with activity to maintain sats.  Will follow acutely.  Marquel Pottenger W,PT Acute Rehabilitation Services Pager:  [phone]  Office:  [phone]

## 2020-04-08 NOTE — Progress Notes (Signed)
Progress Note    [name]  WIO:[id number] DOB: Oct 31, 1945  DOA: 04/05/2020 PCP: Ernestene Kiel, MD    Brief Narrative:     Medical records reviewed and are as summarized below:  Amanda Bauer is an 74 y.o. female with prior history of former smoker, CKD stage IIIb, AR, HTN, HLD, hypothyroidism, osteoarthritis, barrett esophagus, and hiatal hernia presenting from home 11/20 with shortness of breath and slurred speech.  Patient had reported she woke up feeling short of breath, found by EMS hypoxic in the 80's on room air, up to 94 on 6L [place].    Assessment/Plan:   Active Problems:   Hypertension   CKD (chronic kidney disease) stage 3, GFR 30-59 ml/min (HCC)   Acute respiratory failure (HCC)   AKI (acute kidney injury) ([place])   Obesity   Acute respiratory failure -not on O2 at home -wean to room air if able -was in the 80s on RA when EMS arrived -home O2 study  Acute on chronic diastolic CHF -IV lasix  AKI on CKD 3b -baseline CR 1.3 -up to 2.9 here after CTA to r/o PE -continue diuresis and daily monitor of Cr x 1 day  -protein in urine -not sure who patient's nephrologist is  Nonrheumatic aortic valve insufficiency  -follow with Dr. Bettina Gavia  OSA -outpatient sleepy study -per PCCM will need CPAP/BIPAP upon d/c  HTN emergency -last visit with cardiology on 10/13:  1. She has longstanding poorly controlled hypertension with endorgan damage.  Diastolic function is variable and I suspect that she had an element of diastolic heart failure with poorly controlled hypertension.  She is improved she is not fluid overloaded at this time I would not put her on a loop diuretic.  The goal here is to control her hypertension.  On when a switch her from a atenolol to carvedilol continue valsartan allow her nephrologist to make a decision about diuretic either loop or MRA with her CKD.  Another good choice for her would be a calcium channel blocker African-American patient.  I do not  think she needs an ischemia evaluation. 2. To be seen by nephrology tomorrow with proteinuria she is at higher risk and I suspect she has hypertensive nephropathy her kidney function may transiently decrease but will improve and stabilize long-term with hypertension control especially with ARB.  obesity Body mass index is 37.9 kg/m.  Hypothyroidism -PO synthroid    Family Communication/Anticipated D/C date and plan/Code Status   DVT prophylaxis: heparin Code Status: Full Code.  Disposition Plan: Status is: Inpatient  Remains inpatient appropriate because:IV treatments appropriate due to intensity of illness or inability to take PO   Dispo: The patient is from: Home              Anticipated d/c is to: Home              Anticipated d/c date is: 2 days              Patient currently is not medically stable to d/c.         Medical Consultants:    PCCM  Subjective:   Does not wear O2 at home   Objective:    Vitals:   04/08/20 0900 04/08/20 0907 04/08/20 1000 04/08/20 1100  BP:   126/66   Pulse: 69 67 (!) 59 63  Resp: 13  (!) 28 17  Temp:      TempSrc:      SpO2: 93%  96% 92%  Weight:      Height:        Intake/Output Summary (Last 24 hours) at 04/08/2020 1124 Last data filed at 04/07/2020 1600 Gross per 24 hour  Intake --  Output 600 ml  Net -600 ml   Filed Weights   04/06/20 0500 04/07/20 0500 04/08/20 0434  Weight: 106.6 kg 107.2 kg 106.5 kg    Exam:  General: Appearance:    Obese female in no acute distress     Lungs:     Breath sounds diminished, respirations unlabored  Heart:    Normal heart rate. Normal rhythm.    MS:   All extremities are intact.   Neurologic:   Awake, alert, oriented x 3. No apparent focal neurological           defect.     Data Reviewed:   I have personally reviewed following labs and imaging studies:  Labs: Labs show the following:   Basic Metabolic Panel: Recent Labs  Lab 04/05/20 1339 04/05/20 1616  04/05/20 2045 04/05/20 2045 04/06/20 0342 04/06/20 1628 04/06/20 1750 04/06/20 1750 04/07/20 0054 04/08/20 0043  NA 139   < > 140  --  138  --  140  --  137 137  K 4.5   < > 5.0   < > 5.6*   < > 4.0   < > 4.1 4.6  CL 101  --   --   --  104  --   --   --  100 99  CO2 31  --   --   --  27  --   --   --  29 24  GLUCOSE 160*  --   --   --  105*  --   --   --  100* 120*  BUN 20  --   --   --  20  --   --   --  39* 51*  CREATININE 1.42*  --   --   --  1.55*  --   --   --  2.94* 2.50*  CALCIUM 9.4  --   --   --  9.3  --   --   --  9.0 9.4  MG  --   --   --   --  2.0  --   --   --  1.9  --   PHOS  --   --   --   --  5.0*  --   --   --  4.9*  --    < > = values in this interval not displayed.   GFR Estimated Creatinine Clearance: 23.6 mL/min (A) (by C-G formula based on SCr of 2.5 mg/dL (H)). Liver Function Tests: Recent Labs  Lab 04/05/20 1339 04/07/20 0054  AST 14*  --   ALT 15  --   ALKPHOS 100  --   BILITOT 0.4  --   PROT 7.8  --   ALBUMIN 3.2* 2.6*   No results for input(s): LIPASE, AMYLASE in the last 168 hours. Recent Labs  Lab 04/05/20 1509  AMMONIA 35   Coagulation profile Recent Labs  Lab 04/05/20 1339  INR 1.0    CBC: Recent Labs  Lab 04/05/20 1339 04/05/20 1616 04/05/20 1915 04/05/20 2045 04/06/20 0111 04/06/20 1750 04/07/20 0054  WBC 8.4  --   --   --  10.9*  --  7.9  NEUTROABS 6.8  --   --   --   --   --   --  HGB 13.7   < > 15.6* 15.6* 13.2 13.6 11.8*  HCT 47.7*   < > 46.0 46.0 45.3 40.0 38.8  MCV 90.0  --   --   --  88.6  --  88.2  PLT 261  --   --   --  258  --  224   < > = values in this interval not displayed.   Cardiac Enzymes: No results for input(s): CKTOTAL, CKMB, CKMBINDEX, TROPONINI in the last 168 hours. BNP (last 3 results) No results for input(s): PROBNP in the last 8760 hours. CBG: Recent Labs  Lab 04/07/20 2039-07-31 04/08/20 0026 04/08/20 0420 04/08/20 0656 04/08/20 1118  GLUCAP 121* 111* 123* 120* 101*   D-Dimer: No  results for input(s): DDIMER in the last 72 hours. Hgb A1c: Recent Labs    04/06/20 0111  HGBA1C 6.2*   Lipid Profile: No results for input(s): CHOL, HDL, LDLCALC, TRIG, CHOLHDL, LDLDIRECT in the last 72 hours. Thyroid function studies: Recent Labs    04/05/20 30-Jul-2057  TSH 2.558   Anemia work up: No results for input(s): VITAMINB12, FOLATE, FERRITIN, TIBC, IRON, RETICCTPCT in the last 72 hours. Sepsis Labs: Recent Labs  Lab 04/05/20 1339 04/06/20 0111 04/06/20 0903 04/07/20 0054  PROCALCITON  --   --  <0.10 <0.10  WBC 8.4 10.9*  --  7.9    Microbiology Recent Results (from the past 240 hour(s))  Respiratory Panel by RT PCR (Flu A&B, Covid) - Nasopharyngeal Swab     Status: None   Collection Time: 04/05/20  3:40 PM   Specimen: Nasopharyngeal Swab; Nasopharyngeal(NP) swabs in vial transport medium  Result Value Ref Range Status   SARS Coronavirus 2 by RT PCR NEGATIVE NEGATIVE Final    Comment: (NOTE) SARS-CoV-2 target nucleic acids are NOT DETECTED.  The SARS-CoV-2 RNA is generally detectable in upper respiratoy specimens during the acute phase of infection. The lowest concentration of SARS-CoV-2 viral copies this assay can detect is 131 copies/mL. A negative result does not preclude SARS-Cov-2 infection and should not be used as the sole basis for treatment or other patient management decisions. A negative result may occur with  improper specimen collection/handling, submission of specimen other than nasopharyngeal swab, presence of viral mutation(s) within the areas targeted by this assay, and inadequate number of viral copies (<131 copies/mL). A negative result must be combined with clinical observations, patient history, and epidemiological information. The expected result is Negative.  Fact Sheet for Patients:  PinkCheek.be  Fact Sheet for Healthcare Providers:  GravelBags.it  This test is no t yet  approved or cleared by the [place] FDA and  has been authorized for detection and/or diagnosis of SARS-CoV-2 by FDA under an Emergency Use Authorization (EUA). This EUA will remain  in effect (meaning this test can be used) for the duration of the COVID-19 declaration under Section 564(b)(1) of the Act, 21 U.S.C. section 360bbb-3(b)(1), unless the authorization is terminated or revoked sooner.     Influenza A by PCR NEGATIVE NEGATIVE Final   Influenza B by PCR NEGATIVE NEGATIVE Final    Comment: (NOTE) The Xpert Xpress SARS-CoV-2/FLU/RSV assay is intended as an aid in  the diagnosis of influenza from Nasopharyngeal swab specimens and  should not be used as a sole basis for treatment. Nasal washings and  aspirates are unacceptable for Xpert Xpress SARS-CoV-2/FLU/RSV  testing.  Fact Sheet for Patients: PinkCheek.be  Fact Sheet for Healthcare Providers: GravelBags.it  This test is not yet approved or cleared by the  [place] Architectural technologist and  has been authorized for detection and/or diagnosis of SARS-CoV-2 by  FDA under an Print production planner (EUA). This EUA will remain  in effect (meaning this test can be used) for the duration of the  Covid-19 declaration under Section 564(b)(1) of the Act, 21  U.S.C. section 360bbb-3(b)(1), unless the authorization is  terminated or revoked. Performed at [facility], [place] [address], [place], [place] [address]   MRSA PCR Screening     Status: None   Collection Time: 04/05/20 10:51 PM   Specimen: Nasopharyngeal  Result Value Ref Range Status   MRSA by PCR NEGATIVE NEGATIVE Final    Comment:        The GeneXpert MRSA Assay (FDA approved for NASAL specimens only), is one component of a comprehensive MRSA colonization surveillance program. It is not intended to diagnose MRSA infection nor to guide or monitor treatment for MRSA infections. Performed at [facility], [place] [address], [place], [place] [address]   Culture, blood (routine x 2)     Status: None (Preliminary result)   Collection Time: 04/06/20  9:00 AM   Specimen: BLOOD  Result Value Ref Range Status   Specimen Description BLOOD RIGHT ANTECUBITAL  Final   Special Requests   Final    BOTTLES DRAWN AEROBIC AND ANAEROBIC Blood Culture adequate volume   Culture   Final    NO GROWTH 2 DAYS Performed at [facility], [place] [address], [place], [place] [address]    Report Status PENDING  Incomplete  Culture, blood (routine x 2)     Status: None (Preliminary result)   Collection Time: 04/06/20  9:03 AM   Specimen: BLOOD RIGHT HAND  Result Value Ref Range Status   Specimen Description BLOOD RIGHT HAND  Final   Special Requests   Final    BOTTLES DRAWN AEROBIC AND ANAEROBIC Blood Culture adequate volume   Culture   Final    NO GROWTH 2 DAYS Performed at [facility], [place] [address], [place], [place] [address]    Report Status PENDING  Incomplete    Procedures and diagnostic studies:  DG Chest Port 1 View  Result Date: 04/07/2020 CLINICAL DATA:  Acute respiratory failure hypoxia. Shortness of breath. EXAM: PORTABLE CHEST 1 VIEW COMPARISON:  One-view chest x-ray 04/06/2020 FINDINGS: Heart size is normal. Atherosclerotic calcifications are present at the arch. Volumes remain low. Aeration is improving. Bibasilar airspace disease remains, left greater than right. IMPRESSION: Improving aeration with persistent bibasilar airspace disease, left greater than right. Electronically Signed   By: San Morelle M.D.   On: 04/07/2020 07:07   ECHOCARDIOGRAM COMPLETE  Result Date: 04/06/2020    ECHOCARDIOGRAM REPORT   Patient Name:   TATTIANA FAKHOURI  Date of Exam: 04/06/2020 Medical Rec #:  [id number]  Height:       66.0 in Accession #:    [id number] Weight:       235.0 lb Date of Birth:  April 15, 1946 BSA:          2.142 m Patient Age:    49 years   BP:           110/58 mmHg Patient  Gender: F          HR:           54 bpm. Exam Location:  Inpatient Procedure: 2D Echo, Color Doppler and Cardiac Doppler Indications:    R06.9 DOE  History:  Patient has no prior history of Echocardiogram examinations.                 Risk Factors:Hypertension, Dyslipidemia and Sleep Apnea.  Sonographer:    Raquel Sarna Senior RDCS Referring Phys: [phone] KATHRYN A WHITEHEART  Sonographer Comments: Technically difficult due to patient body habitus. Scanned upright on bi-pap. IMPRESSIONS  1. Left ventricular ejection fraction, by estimation, is 60 to 65%. The left ventricle has normal function. The left ventricle has no regional wall motion abnormalities. There is severe left ventricular hypertrophy. Left ventricular diastolic parameters  are indeterminate.  2. Right ventricular systolic function is normal. The right ventricular size is mildly enlarged. There is mildly elevated pulmonary artery systolic pressure. The estimated right ventricular systolic pressure is 28.7 mmHg.  3. Left atrial size was mildly dilated.  4. Right atrial size was severely dilated.  5. The mitral valve is normal in structure. No evidence of mitral valve regurgitation.  6. The aortic valve is tricuspid. Aortic valve regurgitation is trivial. No aortic stenosis is present.  7. Aortic dilatation noted. There is mild dilatation of the ascending aorta, measuring 37 mm.  8. The inferior vena cava is normal in size with <50% respiratory variability, suggesting right atrial pressure of 8 mmHg. FINDINGS  Left Ventricle: Left ventricular ejection fraction, by estimation, is 60 to 65%. The left ventricle has normal function. The left ventricle has no regional wall motion abnormalities. The left ventricular internal cavity size was normal in size. There is  severe left ventricular hypertrophy. Left ventricular diastolic parameters are indeterminate. Right Ventricle: The right ventricular size is mildly enlarged. Right vetricular wall thickness was not  assessed. Right ventricular systolic function is normal. There is mildly elevated pulmonary artery systolic pressure. The tricuspid regurgitant velocity is 2.86 m/s, and with an assumed right atrial pressure of 8 mmHg, the estimated right ventricular systolic pressure is 68.1 mmHg. Left Atrium: Left atrial size was mildly dilated. Right Atrium: Right atrial size was severely dilated. Pericardium: There is no evidence of pericardial effusion. Mitral Valve: The mitral valve is normal in structure. No evidence of mitral valve regurgitation. Tricuspid Valve: The tricuspid valve is normal in structure. Tricuspid valve regurgitation is trivial. Aortic Valve: The aortic valve is tricuspid. Aortic valve regurgitation is trivial. Aortic regurgitation PHT measures 641 msec. No aortic stenosis is present. Pulmonic Valve: The pulmonic valve was not well visualized. Pulmonic valve regurgitation is not visualized. Aorta: The aortic root is normal in size and structure and aortic dilatation noted. There is mild dilatation of the ascending aorta, measuring 37 mm. Venous: The inferior vena cava is normal in size with less than 50% respiratory variability, suggesting right atrial pressure of 8 mmHg. IAS/Shunts: The interatrial septum was not well visualized.  LEFT VENTRICLE PLAX 2D LVIDd:         4.20 cm  Diastology LVIDs:         2.60 cm  LV e' medial:    6.85 cm/s LV PW:         1.60 cm  LV E/e' medial:  8.8 LV IVS:        1.30 cm  LV e' lateral:   3.59 cm/s LVOT diam:     2.20 cm  LV E/e' lateral: 16.8 LV SV:         66 LV SV Index:   31 LVOT Area:     3.80 cm  RIGHT VENTRICLE RV S prime:     16.10 cm/s TAPSE (M-mode): 2.1 cm  LEFT ATRIUM             Index       RIGHT ATRIUM           Index LA diam:        3.70 cm 1.73 cm/m  RA Area:     30.50 cm LA Vol (A2C):   86.2 ml 40.25 ml/m RA Volume:   107.00 ml 49.96 ml/m LA Vol (A4C):   58.4 ml 27.27 ml/m LA Biplane Vol: 72.1 ml 33.66 ml/m  AORTIC VALVE LVOT Vmax:   91.40 cm/s LVOT  Vmean:  68.400 cm/s LVOT VTI:    0.173 m AI PHT:      641 msec  AORTA Ao Root diam: 3.70 cm Ao Asc diam:  3.70 cm MITRAL VALVE               TRICUSPID VALVE MV Area (PHT): 2.17 cm    TV Peak grad:   32.7 mmHg MV Decel Time: 349 msec    TV Vmax:        2.86 m/s MV E velocity: 60.40 cm/s  TR Peak grad:   32.7 mmHg MV A velocity: 54.40 cm/s  TR Vmax:        286.00 cm/s MV E/A ratio:  1.11                            SHUNTS                            Systemic VTI:  0.17 m                            Systemic Diam: 2.20 cm Oswaldo Milian MD Electronically signed by Oswaldo Milian MD Signature Date/Time: 04/06/2020/12:14:14 PM    Final     Medications:   . atorvastatin  10 mg Oral Daily  . carvedilol  12.5 mg Oral BID  . chlorhexidine gluconate (MEDLINE KIT)  15 mL Mouth Rinse BID  . Chlorhexidine Gluconate Cloth  6 each Topical Daily  . furosemide  40 mg Intravenous Daily  . heparin  5,000 Units Subcutaneous Q8H  . insulin aspart  2-6 Units Subcutaneous Q4H  . levothyroxine  112 mcg Oral QAC breakfast  . sodium chloride flush  3 mL Intravenous Once   Continuous Infusions:   LOS: 3 days   Geradine Girt  [facility]   How to contact the [facility] Attending or Consulting provider [place] or covering provider during after hours [place], for this patient?  1. Check the care team in [facility] and look for a) attending/consulting [facility] provider listed and b) the [facility] team listed 2. Log into [email or web address] and use [place]'s universal password to access. If you do not have the password, please contact the hospital operator. 3. Locate the [facility] provider you are looking for under [facility] and page to a number that you can be directly reached. 4. If you still have difficulty reaching the provider, please page the [facility] (Director on Call) for the Hospitalists listed on amion for assistance.  04/08/2020, 11:24 AM

## 2020-04-09 DIAGNOSIS — N1832 Chronic kidney disease, stage 3b: Secondary | ICD-10-CM | POA: Diagnosis not present

## 2020-04-09 DIAGNOSIS — J9601 Acute respiratory failure with hypoxia: Secondary | ICD-10-CM | POA: Diagnosis not present

## 2020-04-09 DIAGNOSIS — N179 Acute kidney failure, unspecified: Secondary | ICD-10-CM | POA: Diagnosis not present

## 2020-04-09 LAB — CBC
HCT: 40.7 % (ref 36.0–46.0)
Hemoglobin: 12.3 g/dL (ref 12.0–15.0)
MCH: 26.3 pg (ref 26.0–34.0)
MCHC: 30.2 g/dL (ref 30.0–36.0)
MCV: 87 fL (ref 80.0–100.0)
Platelets: 222 10*3/uL (ref 150–400)
RBC: 4.68 MIL/uL (ref 3.87–5.11)
RDW: 16.6 % — ABNORMAL HIGH (ref 11.5–15.5)
WBC: 7.9 10*3/uL (ref 4.0–10.5)
nRBC: 0 % (ref 0.0–0.2)

## 2020-04-09 LAB — BASIC METABOLIC PANEL
Anion gap: 10 (ref 5–15)
BUN: 48 mg/dL — ABNORMAL HIGH (ref 8–23)
CO2: 27 mmol/L (ref 22–32)
Calcium: 9.3 mg/dL (ref 8.9–10.3)
Chloride: 101 mmol/L (ref 98–111)
Creatinine, Ser: 1.84 mg/dL — ABNORMAL HIGH (ref 0.44–1.00)
GFR, Estimated: 28 mL/min — ABNORMAL LOW (ref >60)
Glucose, Bld: 122 mg/dL — ABNORMAL HIGH (ref 70–99)
Potassium: 4.6 mmol/L (ref 3.5–5.1)
Sodium: 138 mmol/L (ref 135–145)

## 2020-04-09 LAB — GLUCOSE, CAPILLARY
Glucose-Capillary: 108 mg/dL — ABNORMAL HIGH (ref 70–99)
Glucose-Capillary: 145 mg/dL — ABNORMAL HIGH (ref 70–99)
Glucose-Capillary: 98 mg/dL (ref 70–99)
Glucose-Capillary: 133 mg/dL — ABNORMAL HIGH (ref 70–99)
Glucose-Capillary: 124 mg/dL — ABNORMAL HIGH (ref 70–99)

## 2020-04-09 MED ORDER — AMLODIPINE BESYLATE 5 MG PO TABS
5.0000 mg | ORAL_TABLET | Freq: Every day | ORAL | Status: DC
  Administered 2020-04-09 – 2020-04-12 (×4): 5 mg via ORAL
  Filled 2020-04-09 (×5): qty 1

## 2020-04-09 MED ORDER — FUROSEMIDE 10 MG/ML IJ SOLN
40.0000 mg | Freq: Once | INTRAMUSCULAR | Status: AC
  Administered 2020-04-09: 40 mg via INTRAVENOUS
  Filled 2020-04-09: qty 4

## 2020-04-09 NOTE — Progress Notes (Signed)
Patient started off on 4 liters nasal canula at a 100% this morning.  Throughout the day she has been weaned down to 1 liter nasal canula at 93%.  Also, patient walked on room air to the bathroom, washed up in front of the sink, then walked back on room air and her oxygen saturations were 89%.  Notified provider of update.

## 2020-04-09 NOTE — Progress Notes (Signed)
Progress Note    Amanda Bauer  PHX:[id number] DOB: 10-05-1945  DOA: 04/05/2020 PCP: Ernestene Kiel, MD    Brief Narrative:     Medical records reviewed and are as summarized below:  Amanda Bauer is an 74 y.o. female with prior history of former smoker, CKD stage IIIb, AR, HTN, HLD, hypothyroidism, osteoarthritis, barrett esophagus, and hiatal hernia presenting from home 11/20 with shortness of breath and slurred speech.  Patient had reported she woke up feeling short of breath, found by EMS hypoxic in the 80's on room air, up to 94 on 6L [place].  Working to wean patient to room air.   Assessment/Plan:   Active Problems:   Hypertension   CKD (chronic kidney disease) stage 3, GFR 30-59 ml/min (HCC)   Acute respiratory failure (HCC)   AKI (acute kidney injury) ([place])   Obesity   Acute respiratory failure -not on O2 at home prior to hospitalization -wean to room air as able Incentive spirometry -was in the 80s on RA when EMS arrived -home O2 study  Acute on chronic diastolic CHF -IV lasix dose daily -daily labs  AKI on CKD 3b -baseline CR 1.3 -up to 2.9 here after CTA to r/o PE -continue diuresis and daily monitor of Cr -protein in urine -not sure who patient's nephrologist is  Nonrheumatic aortic valve insufficiency  -follow with Dr. Bettina Gavia  OSA -outpatient sleepy study -per PCCM will need CPAP/BIPAP upon d/c- have consulted care management  HTN emergency -last visit with cardiology on 10/13:  1. She has longstanding poorly controlled hypertension with endorgan damage.  Diastolic function is variable and I suspect that she had an element of diastolic heart failure with poorly controlled hypertension.  She is improved she is not fluid overloaded at this time I would not put her on a loop diuretic.  The goal here is to control her hypertension.  On when a switch her from a atenolol to carvedilol continue valsartan allow her nephrologist to make a decision about diuretic  either loop or MRA with her CKD.  Another good choice for her would be a calcium channel blocker African-American patient.  I do not think she needs an ischemia evaluation. 2. To be seen by nephrology tomorrow with proteinuria she is at higher risk and I suspect she has hypertensive nephropathy her kidney function may transiently decrease but will improve and stabilize long-term with hypertension control especially with ARB.  obesity Body mass index is 37.9 kg/m.  Hypothyroidism -PO synthroid    Family Communication/Anticipated D/C date and plan/Code Status   DVT prophylaxis: heparin Code Status: Full Code.  Disposition Plan: Status is: Inpatient  Remains inpatient appropriate because:IV treatments appropriate due to intensity of illness or inability to take PO   Dispo: The patient is from: Home              Anticipated d/c is to: Home              Anticipated d/c date is: 2 days              Patient currently is not medically stable to d/c.         Medical Consultants:    PCCM  Subjective:   States breathing is improved- has not gotten an incentive spirometry   Objective:    Vitals:   04/08/20 1431 04/08/20 1440 04/08/20 2102 04/09/20 0430  BP: (!) 126/103  (!) 160/79 (!) 167/73  Pulse: 60  67 73  Resp: 16  20 18  Temp:  98.4 F (36.9 C) 98.1 F (36.7 C) 98.2 F (36.8 C)  TempSrc:  Oral  Oral  SpO2: 94%  97% 98%  Weight:      Height:        Intake/Output Summary (Last 24 hours) at 04/09/2020 1033 Last data filed at 04/09/2020 0100 Gross per 24 hour  Intake --  Output 1125 ml  Net -1125 ml   Filed Weights   04/06/20 0500 04/07/20 0500 04/08/20 0434  Weight: 106.6 kg 107.2 kg 106.5 kg    Exam:   General: Appearance:    Obese female in no acute distress. On [place]     Lungs:     No wheezing, crackles at bases, respirations unlabored  Heart:    Normal heart rate.   MS:   All extremities are intact.   Neurologic:   Awake, alert, oriented x 3. No  apparent focal neurological           defect.      Data Reviewed:   I have personally reviewed following labs and imaging studies:  Labs: Labs show the following:   Basic Metabolic Panel: Recent Labs  Lab 04/05/20 1339 04/05/20 1616 04/06/20 0342 04/06/20 1628 04/06/20 1750 04/06/20 1750 04/07/20 0054 04/07/20 0054 04/08/20 0043 04/09/20 0158  NA 139   < > 138  --  140  --  137  --  137 138  K 4.5   < > 5.6*   < > 4.0   < > 4.1   < > 4.6 4.6  CL 101  --  104  --   --   --  100  --  99 101  CO2 31  --  27  --   --   --  29  --  24 27  GLUCOSE 160*  --  105*  --   --   --  100*  --  120* 122*  BUN 20  --  20  --   --   --  39*  --  51* 48*  CREATININE 1.42*  --  1.55*  --   --   --  2.94*  --  2.50* 1.84*  CALCIUM 9.4  --  9.3  --   --   --  9.0  --  9.4 9.3  MG  --   --  2.0  --   --   --  1.9  --   --   --   PHOS  --   --  5.0*  --   --   --  4.9*  --   --   --    < > = values in this interval not displayed.   GFR Estimated Creatinine Clearance: 32.1 mL/min (A) (by C-G formula based on SCr of 1.84 mg/dL (H)). Liver Function Tests: Recent Labs  Lab 04/05/20 1339 04/07/20 0054  AST 14*  --   ALT 15  --   ALKPHOS 100  --   BILITOT 0.4  --   PROT 7.8  --   ALBUMIN 3.2* 2.6*   No results for input(s): LIPASE, AMYLASE in the last 168 hours. Recent Labs  Lab 04/05/20 1509  AMMONIA 35   Coagulation profile Recent Labs  Lab 04/05/20 1339  INR 1.0    CBC: Recent Labs  Lab 04/05/20 1339 04/05/20 1616 04/05/20 2045 04/06/20 0111 04/06/20 1750 04/07/20 0054 04/09/20 0158  WBC 8.4  --   --  10.9*  --  7.9 7.9  NEUTROABS 6.8  --   --   --   --   --   --   HGB 13.7   < > 15.6* 13.2 13.6 11.8* 12.3  HCT 47.7*   < > 46.0 45.3 40.0 38.8 40.7  MCV 90.0  --   --  88.6  --  88.2 87.0  PLT 261  --   --  258  --  224 222   < > = values in this interval not displayed.   Cardiac Enzymes: No results for input(s): CKTOTAL, CKMB, CKMBINDEX, TROPONINI in the last  168 hours. BNP (last 3 results) No results for input(s): PROBNP in the last 8760 hours. CBG: Recent Labs  Lab 04/08/20 1123 04/08/20 1647 04/08/20 2101 04/09/20 0652 04/09/20 0806  GLUCAP 107* 138* 113* 108* 145*   D-Dimer: No results for input(s): DDIMER in the last 72 hours. Hgb A1c: No results for input(s): HGBA1C in the last 72 hours. Lipid Profile: No results for input(s): CHOL, HDL, LDLCALC, TRIG, CHOLHDL, LDLDIRECT in the last 72 hours. Thyroid function studies: No results for input(s): TSH, T4TOTAL, T3FREE, THYROIDAB in the last 72 hours.  Invalid input(s): FREET3 Anemia work up: No results for input(s): VITAMINB12, FOLATE, FERRITIN, TIBC, IRON, RETICCTPCT in the last 72 hours. Sepsis Labs: Recent Labs  Lab 04/05/20 1339 04/06/20 0111 04/06/20 0903 04/07/20 0054 04/09/20 0158  PROCALCITON  --   --  <0.10 <0.10  --   WBC 8.4 10.9*  --  7.9 7.9    Microbiology Recent Results (from the past 240 hour(s))  Respiratory Panel by RT PCR (Flu A&B, Covid) - Nasopharyngeal Swab     Status: None   Collection Time: 04/05/20  3:40 PM   Specimen: Nasopharyngeal Swab; Nasopharyngeal(NP) swabs in vial transport medium  Result Value Ref Range Status   SARS Coronavirus 2 by RT PCR NEGATIVE NEGATIVE Final    Comment: (NOTE) SARS-CoV-2 target nucleic acids are NOT DETECTED.  The SARS-CoV-2 RNA is generally detectable in upper respiratoy specimens during the acute phase of infection. The lowest concentration of SARS-CoV-2 viral copies this assay can detect is 131 copies/mL. A negative result does not preclude SARS-Cov-2 infection and should not be used as the sole basis for treatment or other patient management decisions. A negative result may occur with  improper specimen collection/handling, submission of specimen other than nasopharyngeal swab, presence of viral mutation(s) within the areas targeted by this assay, and inadequate number of viral copies (<131 copies/mL). A  negative result must be combined with clinical observations, patient history, and epidemiological information. The expected result is Negative.  Fact Sheet for Patients:  PinkCheek.be  Fact Sheet for Healthcare Providers:  GravelBags.it  This test is no t yet approved or cleared by the [place] FDA and  has been authorized for detection and/or diagnosis of SARS-CoV-2 by FDA under an Emergency Use Authorization (EUA). This EUA will remain  in effect (meaning this test can be used) for the duration of the COVID-19 declaration under Section 564(b)(1) of the Act, 21 U.S.C. section 360bbb-3(b)(1), unless the authorization is terminated or revoked sooner.     Influenza A by PCR NEGATIVE NEGATIVE Final   Influenza B by PCR NEGATIVE NEGATIVE Final    Comment: (NOTE) The Xpert Xpress SARS-CoV-2/FLU/RSV assay is intended as an aid in  the diagnosis of influenza from Nasopharyngeal swab specimens and  should not be used as a sole basis for treatment. Nasal washings and  aspirates are unacceptable for  Xpert Xpress SARS-CoV-2/FLU/RSV  testing.  Fact Sheet for Patients: PinkCheek.be  Fact Sheet for Healthcare Providers: GravelBags.it  This test is not yet approved or cleared by the [place] FDA and  has been authorized for detection and/or diagnosis of SARS-CoV-2 by  FDA under an Emergency Use Authorization (EUA). This EUA will remain  in effect (meaning this test can be used) for the duration of the  Covid-19 declaration under Section 564(b)(1) of the Act, 21  U.S.C. section 360bbb-3(b)(1), unless the authorization is  terminated or revoked. Performed at [facility], [place] [address], [place], [place] [address]   MRSA PCR Screening     Status: None   Collection Time: 04/05/20 10:51 PM   Specimen: Nasopharyngeal  Result Value Ref Range Status   MRSA by PCR  NEGATIVE NEGATIVE Final    Comment:        The GeneXpert MRSA Assay (FDA approved for NASAL specimens only), is one component of a comprehensive MRSA colonization surveillance program. It is not intended to diagnose MRSA infection nor to guide or monitor treatment for MRSA infections. Performed at [facility], [place] [address] [place], [place] [address]   Culture, blood (routine x 2)     Status: None (Preliminary result)   Collection Time: 04/06/20  9:00 AM   Specimen: BLOOD  Result Value Ref Range Status   Specimen Description BLOOD RIGHT ANTECUBITAL  Final   Special Requests   Final    BOTTLES DRAWN AEROBIC AND ANAEROBIC Blood Culture adequate volume   Culture   Final    NO GROWTH 3 DAYS Performed at [facility], [place] [address], [place], [place] [address]    Report Status PENDING  Incomplete  Culture, blood (routine x 2)     Status: None (Preliminary result)   Collection Time: 04/06/20  9:03 AM   Specimen: BLOOD RIGHT HAND  Result Value Ref Range Status   Specimen Description BLOOD RIGHT HAND  Final   Special Requests   Final    BOTTLES DRAWN AEROBIC AND ANAEROBIC Blood Culture adequate volume   Culture   Final    NO GROWTH 3 DAYS Performed at [facility], [place] [address], [place], [place] [address]    Report Status PENDING  Incomplete    Procedures and diagnostic studies:  No results found.  Medications:   . amLODipine  5 mg Oral Daily  . atorvastatin  10 mg Oral Daily  . carvedilol  12.5 mg Oral BID  . chlorhexidine gluconate (MEDLINE KIT)  15 mL Mouth Rinse BID  . Chlorhexidine Gluconate Cloth  6 each Topical Daily  . heparin  5,000 Units Subcutaneous Q8H  . insulin aspart  0-5 Units Subcutaneous QHS  . insulin aspart  0-6 Units Subcutaneous TID WC  . levothyroxine  112 mcg Oral QAC breakfast  . sodium chloride flush  3 mL Intravenous Once   Continuous Infusions:   LOS: 4 days   Geradine Girt  [facility]   How to contact  the [facility] Attending or Consulting provider [place] or covering provider during after hours [place], for this patient?  1. Check the care team in [facility] and look for a) attending/consulting [facility] provider listed and b) the [facility] team listed 2. Log into [email or web address] and use [facility]'s universal password to access. If you do not have the password, please contact the hospital operator. 3. Locate the [facility] provider you are looking for under [facility] and page to a number that you can be directly reached. 4. If you still have difficulty reaching the provider, please page the [facility] (Director on Call) for the Hospitalists listed on amion for assistance.  04/09/2020, 10:33 AM

## 2020-04-10 DIAGNOSIS — N1832 Chronic kidney disease, stage 3b: Secondary | ICD-10-CM | POA: Diagnosis not present

## 2020-04-10 DIAGNOSIS — J9601 Acute respiratory failure with hypoxia: Secondary | ICD-10-CM | POA: Diagnosis not present

## 2020-04-10 DIAGNOSIS — J9602 Acute respiratory failure with hypercapnia: Secondary | ICD-10-CM | POA: Diagnosis not present

## 2020-04-10 LAB — CBC
HCT: 38.5 % (ref 36.0–46.0)
Hemoglobin: 11.4 g/dL — ABNORMAL LOW (ref 12.0–15.0)
MCH: 26.3 pg (ref 26.0–34.0)
MCHC: 29.6 g/dL — ABNORMAL LOW (ref 30.0–36.0)
MCV: 88.7 fL (ref 80.0–100.0)
Platelets: 226 10*3/uL (ref 150–400)
RBC: 4.34 MIL/uL (ref 3.87–5.11)
RDW: 16.2 % — ABNORMAL HIGH (ref 11.5–15.5)
WBC: 6.9 10*3/uL (ref 4.0–10.5)
nRBC: 0 % (ref 0.0–0.2)

## 2020-04-10 LAB — BASIC METABOLIC PANEL
Anion gap: 8 (ref 5–15)
BUN: 41 mg/dL — ABNORMAL HIGH (ref 8–23)
CO2: 29 mmol/L (ref 22–32)
Calcium: 9.4 mg/dL (ref 8.9–10.3)
Chloride: 101 mmol/L (ref 98–111)
Creatinine, Ser: 1.62 mg/dL — ABNORMAL HIGH (ref 0.44–1.00)
GFR, Estimated: 33 mL/min — ABNORMAL LOW (ref >60)
Glucose, Bld: 122 mg/dL — ABNORMAL HIGH (ref 70–99)
Potassium: 5.2 mmol/L — ABNORMAL HIGH (ref 3.5–5.1)
Sodium: 138 mmol/L (ref 135–145)

## 2020-04-10 LAB — GLUCOSE, CAPILLARY
Glucose-Capillary: 105 mg/dL — ABNORMAL HIGH (ref 70–99)
Glucose-Capillary: 110 mg/dL — ABNORMAL HIGH (ref 70–99)
Glucose-Capillary: 129 mg/dL — ABNORMAL HIGH (ref 70–99)
Glucose-Capillary: 132 mg/dL — ABNORMAL HIGH (ref 70–99)

## 2020-04-10 MED ORDER — FUROSEMIDE 40 MG PO TABS
40.0000 mg | ORAL_TABLET | Freq: Every day | ORAL | Status: DC
  Administered 2020-04-11 – 2020-04-12 (×2): 40 mg via ORAL
  Filled 2020-04-10 (×2): qty 1

## 2020-04-10 NOTE — Progress Notes (Signed)
Progress Note    Amanda Bauer  OMB:[id number] DOB: 1945/05/26  DOA: 04/05/2020 PCP: Ernestene Kiel, MD    Brief Narrative:     Medical records reviewed and are as summarized below:  Amanda Bauer is an 74 y.o. female with prior history of former smoker, CKD stage IIIb, AR, HTN, HLD, hypothyroidism, osteoarthritis, barrett esophagus, and hiatal hernia presenting from home 11/20 with shortness of breath and slurred speech.  Patient had reported she woke up feeling short of breath, found by EMS hypoxic in the 80's on room air, up to 94 on 6L [place].  Await CPAP for home.  Slowly being weaned to room air.  Await home O2 eval.   Assessment/Plan:   Active Problems:   Hypertension   CKD (chronic kidney disease) stage 3, GFR 30-59 ml/min (HCC)   Acute respiratory failure (HCC)   AKI (acute kidney injury) ([place])   Obesity   Acute respiratory failure -not on O2 at home prior to hospitalization -wean to room air -- will need home O2 eval -Incentive spirometry -was in the 80s on RA when EMS arrived  Acute on chronic diastolic CHF -IV lasix -- change to PO -daily labs  AKI on CKD 3b -baseline CR 1.3 -up to 2.9 here after CTA to r/o PE -continue diuresis and daily monitor of Cr -protein in urine -appears to follow with Dr. Hollie Salk  Nonrheumatic aortic valve insufficiency  -follow with Dr. Bettina Gavia  OSA -outpatient sleepy study -per PCCM will need CPAP/BIPAP upon d/c -TOC to arrange for the AM  HTN emergency -last visit with cardiology on 10/13:  1. She has longstanding poorly controlled hypertension with endorgan damage.  Diastolic function is variable and I suspect that she had an element of diastolic heart failure with poorly controlled hypertension.  She is improved she is not fluid overloaded at this time I would not put her on a loop diuretic.  The goal here is to control her hypertension.  On when a switch her from a atenolol to carvedilol continue valsartan allow her  nephrologist to make a decision about diuretic either loop or MRA with her CKD.  Another good choice for her would be a calcium channel blocker African-American patient.  I do not think she needs an ischemia evaluation. 2. To be seen by nephrology tomorrow with proteinuria she is at higher risk and I suspect she has hypertensive nephropathy her kidney function may transiently decrease but will improve and stabilize long-term with hypertension control especially with ARB.  obesity Body mass index is 37.9 kg/m.  Hypothyroidism -PO synthroid    Family Communication/Anticipated D/C date and plan/Code Status   DVT prophylaxis: heparin Code Status: Full Code.  Disposition Plan: Status is: Inpatient Spoke with daughter on phone Remains inpatient appropriate because:IV treatments appropriate due to intensity of illness or inability to take PO   Dispo: The patient is from: Home              Anticipated d/c is to: Home              Anticipated d/c date is: 2 days              Patient currently is not medically stable to d/c.         Medical Consultants:    PCCM  Subjective:   No current complaints-- tolerated cpap for 6 hours last PM  Objective:    Vitals:   04/09/20 1410 04/09/20 2058 04/10/20 0511 04/10/20 1137  BP:  130/62 (!) 150/71 128/66 (!) 137/59  Pulse: (!) 58 61 (!) 59 63  Resp: 18 20 20 18   Temp: 98.3 F (36.8 C) 98 F (36.7 C) 98 F (36.7 C) 99.3 F (37.4 C)  TempSrc: Oral  Oral   SpO2: 97% 93% 94% 96%  Weight:      Height:       No intake or output data in the 24 hours ending 04/10/20 1321 Filed Weights   04/06/20 0500 04/07/20 0500 04/08/20 0434  Weight: 106.6 kg 107.2 kg 106.5 kg    Exam:  General: Appearance:    Obese female in no acute distress     Lungs:     Improve aeration respirations unlabored     MS:   All extremities are intact.   Neurologic:   Awake, alert, oriented x 3. No apparent focal neurological           defect.     Data  Reviewed:   I have personally reviewed following labs and imaging studies:  Labs: Labs show the following:   Basic Metabolic Panel: Recent Labs  Lab 04/06/20 0342 04/06/20 1628 04/06/20 1750 04/06/20 1750 04/07/20 0054 04/07/20 0054 04/08/20 0043 04/08/20 0043 04/09/20 0158 04/10/20 0244  NA 138  --  140  --  137  --  137  --  138 138  K 5.6*   < > 4.0   < > 4.1   < > 4.6   < > 4.6 5.2*  CL 104  --   --   --  100  --  99  --  101 101  CO2 27  --   --   --  29  --  24  --  27 29  GLUCOSE 105*  --   --   --  100*  --  120*  --  122* 122*  BUN 20  --   --   --  39*  --  51*  --  48* 41*  CREATININE 1.55*  --   --   --  2.94*  --  2.50*  --  1.84* 1.62*  CALCIUM 9.3  --   --   --  9.0  --  9.4  --  9.3 9.4  MG 2.0  --   --   --  1.9  --   --   --   --   --   PHOS 5.0*  --   --   --  4.9*  --   --   --   --   --    < > = values in this interval not displayed.   GFR Estimated Creatinine Clearance: 36.5 mL/min (A) (by C-G formula based on SCr of 1.62 mg/dL (H)). Liver Function Tests: Recent Labs  Lab 04/05/20 1339 04/07/20 0054  AST 14*  --   ALT 15  --   ALKPHOS 100  --   BILITOT 0.4  --   PROT 7.8  --   ALBUMIN 3.2* 2.6*   No results for input(s): LIPASE, AMYLASE in the last 168 hours. Recent Labs  Lab 04/05/20 1509  AMMONIA 35   Coagulation profile Recent Labs  Lab 04/05/20 1339  INR 1.0    CBC: Recent Labs  Lab 04/05/20 1339 04/05/20 1616 04/06/20 0111 04/06/20 1750 04/07/20 0054 04/09/20 0158 04/10/20 0244  WBC 8.4  --  10.9*  --  7.9 7.9 6.9  NEUTROABS 6.8  --   --   --   --   --   --  HGB 13.7   < > 13.2 13.6 11.8* 12.3 11.4*  HCT 47.7*   < > 45.3 40.0 38.8 40.7 38.5  MCV 90.0  --  88.6  --  88.2 87.0 88.7  PLT 261  --  258  --  224 222 226   < > = values in this interval not displayed.   Cardiac Enzymes: No results for input(s): CKTOTAL, CKMB, CKMBINDEX, TROPONINI in the last 168 hours. BNP (last 3 results) No results for input(s):  PROBNP in the last 8760 hours. CBG: Recent Labs  Lab 04/09/20 1200 04/09/20 1552 04/09/20 2056 04/10/20 0719 04/10/20 1132  GLUCAP 98 133* 124* 105* 110*   D-Dimer: No results for input(s): DDIMER in the last 72 hours. Hgb A1c: No results for input(s): HGBA1C in the last 72 hours. Lipid Profile: No results for input(s): CHOL, HDL, LDLCALC, TRIG, CHOLHDL, LDLDIRECT in the last 72 hours. Thyroid function studies: No results for input(s): TSH, T4TOTAL, T3FREE, THYROIDAB in the last 72 hours.  Invalid input(s): FREET3 Anemia work up: No results for input(s): VITAMINB12, FOLATE, FERRITIN, TIBC, IRON, RETICCTPCT in the last 72 hours. Sepsis Labs: Recent Labs  Lab 04/06/20 0111 04/06/20 0903 04/07/20 0054 04/09/20 0158 04/10/20 0244  PROCALCITON  --  <0.10 <0.10  --   --   WBC 10.9*  --  7.9 7.9 6.9    Microbiology Recent Results (from the past 240 hour(s))  Respiratory Panel by RT PCR (Flu A&B, Covid) - Nasopharyngeal Swab     Status: None   Collection Time: 04/05/20  3:40 PM   Specimen: Nasopharyngeal Swab; Nasopharyngeal(NP) swabs in vial transport medium  Result Value Ref Range Status   SARS Coronavirus 2 by RT PCR NEGATIVE NEGATIVE Final    Comment: (NOTE) SARS-CoV-2 target nucleic acids are NOT DETECTED.  The SARS-CoV-2 RNA is generally detectable in upper respiratoy specimens during the acute phase of infection. The lowest concentration of SARS-CoV-2 viral copies this assay can detect is 131 copies/mL. A negative result does not preclude SARS-Cov-2 infection and should not be used as the sole basis for treatment or other patient management decisions. A negative result may occur with  improper specimen collection/handling, submission of specimen other than nasopharyngeal swab, presence of viral mutation(s) within the areas targeted by this assay, and inadequate number of viral copies (<131 copies/mL). A negative result must be combined with clinical observations,  patient history, and epidemiological information. The expected result is Negative.  Fact Sheet for Patients:  PinkCheek.be  Fact Sheet for Healthcare Providers:  GravelBags.it  This test is no t yet approved or cleared by the [place] FDA and  has been authorized for detection and/or diagnosis of SARS-CoV-2 by FDA under an Emergency Use Authorization (EUA). This EUA will remain  in effect (meaning this test can be used) for the duration of the COVID-19 declaration under Section 564(b)(1) of the Act, 21 U.S.C. section 360bbb-3(b)(1), unless the authorization is terminated or revoked sooner.     Influenza A by PCR NEGATIVE NEGATIVE Final   Influenza B by PCR NEGATIVE NEGATIVE Final    Comment: (NOTE) The Xpert Xpress SARS-CoV-2/FLU/RSV assay is intended as an aid in  the diagnosis of influenza from Nasopharyngeal swab specimens and  should not be used as a sole basis for treatment. Nasal washings and  aspirates are unacceptable for Xpert Xpress SARS-CoV-2/FLU/RSV  testing.  Fact Sheet for Patients: PinkCheek.be  Fact Sheet for Healthcare Providers: GravelBags.it  This test is not yet approved or cleared by  the [employer] and  has been authorized for detection and/or diagnosis of SARS-CoV-2 by  FDA under an Emergency Use Authorization (EUA). This EUA will remain  in effect (meaning this test can be used) for the duration of the  Covid-19 declaration under Section 564(b)(1) of the Act, 21  U.S.C. section 360bbb-3(b)(1), unless the authorization is  terminated or revoked. Performed at [facility], [place] [address], [place], [place] [address]   MRSA PCR Screening     Status: None   Collection Time: 04/05/20 10:51 PM   Specimen: Nasopharyngeal  Result Value Ref Range Status   MRSA by PCR NEGATIVE NEGATIVE Final    Comment:        The GeneXpert  MRSA Assay (FDA approved for NASAL specimens only), is one component of a comprehensive MRSA colonization surveillance program. It is not intended to diagnose MRSA infection nor to guide or monitor treatment for MRSA infections. Performed at [facility], [place] [address] [place], [place] [address]   Culture, blood (routine x 2)     Status: None (Preliminary result)   Collection Time: 04/06/20  9:00 AM   Specimen: BLOOD  Result Value Ref Range Status   Specimen Description BLOOD RIGHT ANTECUBITAL  Final   Special Requests   Final    BOTTLES DRAWN AEROBIC AND ANAEROBIC Blood Culture adequate volume   Culture   Final    NO GROWTH 4 DAYS Performed at [facility], [place] [address], [place], [place] [address]    Report Status PENDING  Incomplete  Culture, blood (routine x 2)     Status: None (Preliminary result)   Collection Time: 04/06/20  9:03 AM   Specimen: BLOOD RIGHT HAND  Result Value Ref Range Status   Specimen Description BLOOD RIGHT HAND  Final   Special Requests   Final    BOTTLES DRAWN AEROBIC AND ANAEROBIC Blood Culture adequate volume   Culture   Final    NO GROWTH 4 DAYS Performed at [facility], [place] [address] [place], [place] [address]    Report Status PENDING  Incomplete    Procedures and diagnostic studies:  No results found.  Medications:   . amLODipine  5 mg Oral Daily  . atorvastatin  10 mg Oral Daily  . carvedilol  12.5 mg Oral BID  . chlorhexidine gluconate (MEDLINE KIT)  15 mL Mouth Rinse BID  . Chlorhexidine Gluconate Cloth  6 each Topical Daily  . heparin  5,000 Units Subcutaneous Q8H  . insulin aspart  0-5 Units Subcutaneous QHS  . insulin aspart  0-6 Units Subcutaneous TID WC  . levothyroxine  112 mcg Oral QAC breakfast  . sodium chloride flush  3 mL Intravenous Once   Continuous Infusions:   LOS: 5 days   Geradine Girt  [facility]   How to contact the [facility] Attending or Consulting provider [place] or  covering provider during after hours [place], for this patient?  1. Check the care team in [facility] and look for a) attending/consulting TRH provider listed and b) the [facility] team listed 2. Log into [email or web address] and use [place]'s universal password to access. If you do not have the password, please contact the hospital operator. 3. Locate the [facility] provider you are looking for under [facility] and page to a number that you can be directly reached. 4. If you still have difficulty reaching the provider, please page the [facility] (Director on Call)  for the Hospitalists listed on amion for assistance.  04/10/2020, 1:21 PM

## 2020-04-10 NOTE — TOC Initial Note (Signed)
Transition of Care [facility]) - Initial/Assessment Note    Patient Details  Name: Amanda Bauer MRN: [id number] Date of Birth: [birth year]  Transition of Care [facility]) CM/SW Contact:    Carley Hammed, LCSWA Phone Number: 04/10/2020, 8:45 AM  Clinical Narrative:                 CSW received message from dr. Asking about the status of oxygen order. No notes indicating oxygen was ordered. Unable to make referral today due to the holiday. Oxygen will need to be ordered first thing 11/26 in order for pt to dc. SW will continue to follow.  Expected Discharge Plan: Home w Home Health Services Barriers to Discharge: Equipment Delay   Patient Goals and CMS Choice Patient states their goals for this hospitalization and ongoing recovery are:: Pt wants to go home with [facility] services.      Expected Discharge Plan and Services Expected Discharge Plan: Home w Home Health Services       Living arrangements for the past 2 months: Single Family Home                                      Prior Living Arrangements/Services Living arrangements for the past 2 months: Single Family Home Lives with:: Adult Children Patient language and need for interpreter reviewed:: Yes        Need for Family Participation in Patient Care: Yes (Comment) Care giver support system in place?: Yes (comment)   Criminal Activity/Legal Involvement Pertinent to Current Situation/Hospitalization: No - Comment as needed  Activities of Daily Living Home Assistive Devices/Equipment: Blood pressure cuff, Eyeglasses ADL Screening (condition at time of admission) Patient's cognitive ability adequate to safely complete daily activities?: No Is the patient deaf or have difficulty hearing?: No Does the patient have difficulty seeing, even when wearing glasses/contacts?: No Does the patient have difficulty concentrating, remembering, or making decisions?: Yes Patient able to express need for assistance with ADLs?: No Does the  patient have difficulty dressing or bathing?: No Independently performs ADLs?: No Walks in Home: Independent Does the patient have difficulty walking or climbing stairs?: No Weakness of Legs: None Weakness of Arms/Hands: None  Permission Sought/Granted      Share Information with NAME: Ricardo Jericho     Permission granted to share info w Relationship: Daughter  Permission granted to share info w Contact Information: [phone]  Emotional Assessment Appearance:: Other (Comment Required (Unable to Assess) Attitude/Demeanor/Rapport: Unable to Assess Affect (typically observed): Unable to Assess Orientation: : Oriented to Self, Oriented to Place, Oriented to  Time, Oriented to Situation Alcohol / Substance Use: Not Applicable Psych Involvement: No (comment)  Admission diagnosis:  Acute respiratory failure (HCC) [J96.00] Acute respiratory failure with hypoxia and hypercapnia (HCC) [J96.01, J96.02] Patient Active Problem List   Diagnosis Date Noted  . AKI (acute kidney injury) (HCC) 04/08/2020  . Obesity 04/08/2020  . Acute respiratory failure (HCC) 04/05/2020  . CKD (chronic kidney disease) stage 3, GFR 30-59 ml/min (HCC) 02/27/2020  . Aortic regurgitation 02/27/2020  . Hypertension 02/20/2020  . Hypothyroidism 02/20/2020  . Osteoarthritis 02/20/2020  . Hyperlipidemia   . Hiatal hernia   . Hemorrhoids   . Diverticula of colon   . Colon polyps   . Barrett esophagus    PCP:  Philemon Kingdom, MD Pharmacy:   [facility] [address], [place] - [address] [facility]  [place] [address] Phone: [phone] Fax: [phone]     Social Determinants of Health (SDOH) Interventions    Readmission Risk Interventions No flowsheet data found.

## 2020-04-11 DIAGNOSIS — J9601 Acute respiratory failure with hypoxia: Secondary | ICD-10-CM | POA: Diagnosis not present

## 2020-04-11 DIAGNOSIS — Z6837 Body mass index (BMI) 37.0-37.9, adult: Secondary | ICD-10-CM | POA: Diagnosis not present

## 2020-04-11 DIAGNOSIS — J9602 Acute respiratory failure with hypercapnia: Secondary | ICD-10-CM | POA: Diagnosis not present

## 2020-04-11 LAB — CULTURE, BLOOD (ROUTINE X 2)
Culture: NO GROWTH
Culture: NO GROWTH
Special Requests: ADEQUATE
Special Requests: ADEQUATE

## 2020-04-11 LAB — GLUCOSE, CAPILLARY
Glucose-Capillary: 100 mg/dL — ABNORMAL HIGH (ref 70–99)
Glucose-Capillary: 80 mg/dL (ref 70–99)
Glucose-Capillary: 139 mg/dL — ABNORMAL HIGH (ref 70–99)
Glucose-Capillary: 99 mg/dL (ref 70–99)

## 2020-04-11 LAB — BASIC METABOLIC PANEL
Anion gap: 11 (ref 5–15)
BUN: 40 mg/dL — ABNORMAL HIGH (ref 8–23)
CO2: 25 mmol/L (ref 22–32)
Calcium: 9.4 mg/dL (ref 8.9–10.3)
Chloride: 102 mmol/L (ref 98–111)
Creatinine, Ser: 1.51 mg/dL — ABNORMAL HIGH (ref 0.44–1.00)
GFR, Estimated: 36 mL/min — ABNORMAL LOW (ref >60)
Glucose, Bld: 112 mg/dL — ABNORMAL HIGH (ref 70–99)
Potassium: 4.5 mmol/L (ref 3.5–5.1)
Sodium: 138 mmol/L (ref 135–145)

## 2020-04-11 MED ORDER — AMLODIPINE BESYLATE 5 MG PO TABS: 5.0000 mg | ORAL_TABLET | Freq: Every day | ORAL | 0 refills | Status: AC

## 2020-04-11 MED ORDER — FUROSEMIDE 40 MG PO TABS: 40.0000 mg | ORAL_TABLET | Freq: Every day | ORAL | 0 refills | Status: AC

## 2020-04-11 NOTE — TOC Transition Note (Addendum)
Transition of Care [facility]) - CM/SW Discharge Note   Patient Details  Name: Quanika Solem MRN: [id number] Date of Birth: 07-03-1945  Transition of Care [facility]) CM/SW Contact:  Kermit Balo, RN Phone Number: 04/11/2020, 3:29 PM   Clinical Narrative:    Pt is discharging home with [facility] services through [place]. Cory with [facility] accepted the referral. Trilogy and oxygen for home with be delivered to the home with transport oxygen to the pts hospital room.  Pt has transport home when oxygen delivered to the room.  1630: Trilogy now scheduled to be delivered tomorrow per daughter request so she has time to go over d/c needs/ meds and trilogy. MD and bedside RN updated.    Final next level of care: Home w Home Health Services Barriers to Discharge: No Barriers Identified   Patient Goals and CMS Choice Patient states their goals for this hospitalization and ongoing recovery are:: Pt wants to go home with [facility] services. CMS Medicare.gov Compare Post Acute Care list provided to:: Patient Represenative (must comment) Choice offered to / list presented to : Adult Children  Discharge Placement                       Discharge Plan and Services                DME Arranged: Oxygen, Other see comment (Trilogy) DME Agency: [employer] Date DME Agency Contacted: 04/11/20   Representative spoke with at DME Agency: [facility]na Hatchet HH Arranged: [facility] Agency: [facility] Date [facility] Agency Contacted: 04/11/20   Representative spoke with at [facility] Agency: Kandee Keen  Social Determinants of Health (SDOH) Interventions     Readmission Risk Interventions No flowsheet data found.

## 2020-04-11 NOTE — Discharge Summary (Signed)
Physician Discharge Summary  Amanda Bauer BOF:[id number] DOB: 1945/08/08 DOA: 04/05/2020  PCP: Philemon Kingdom, MD  Admit date: 04/05/2020 Discharge date: 04/11/2020  Admitted From: home Discharge disposition: home   Recommendations for Outpatient Follow-Up:   1. BMP 1 week 2. triligy until sleep study and CPAP obtained 3. Daily weights   Discharge Diagnosis:   Active Problems:   Hypertension   CKD (chronic kidney disease) stage 3, GFR 30-59 ml/min (HCC)   Acute respiratory failure (HCC)   AKI (acute kidney injury) (HCC)   Obesity    Discharge Condition: Improved.  Diet recommendation: Low sodium, heart healthy.   Wound care: None.  Code status: Full.   History of Present Illness:   74 year old female with prior history of former smoker, CKD stage IIIb, AR, HTN, HLD, hypothyroidism, osteoarthritis, barrett esophagus, and hiatal hernia presenting from home 11/20 with shortness of breath and slurred speech.  Patient had reported she woke up feeling short of breath, found by EMS hypoxic in the 80's on room air, up to 94 on 6L [place].    On arrival to ER, she was afebrile, hypertensive up to 200/93, NSR, and tachypneic. Labs noted for glucose 160, sCr 1.42 (previously 1.3 02/2019), normal WBC.  ABG notable for respiratory acidosis 7.214/82.5/65/33.5  CXR consistent with pulmonary edema and cardiomegaly. Placed on bipap and PCCM consulted for ICU admission. Of note was awaiting sleep study for presumed OSA followed by Dr. [facility] Course by Problem:   Acute respiratory failure -not on O2 at home prior to hospitalization -wean to room air -- will need home O2 with exertion -continue Incentive spirometry -was in the 80s on RA when EMS arrived  Acute on chronic diastolic CHF -IV lasix -- change to PO with close outpatient follow up -BMP 1 week  AKI on CKD 3b -baseline CR 1.3 -up to 2.9 here after CTA to r/o PE -BMP 1 week -appears to follow  with Dr. Signe Colt  Nonrheumatic aortic valve insufficiency  -follow with Dr. Dulce Sellar  OSA -outpatient sleepy study -per PCCM will need CPAP/BIPAP upon d/c -TOC to arrange trilogy until sleep study  HTN emergency -last visit with cardiology on 10/13:  1. She has longstanding poorly controlled hypertension with endorgan damage. Diastolic function is variable and I suspect that she had an element of diastolic heart failure with poorly controlled hypertension. She is improved she is not fluid overloaded at this time I would not put her on a loop diuretic. The goal here is to control her hypertension. On when a switch her from a atenolol to carvedilol continue valsartan allow her nephrologist to make a decision about diuretic either loop or MRA with her CKD. Another good choice for her would be a calcium channel blocker African-American patient. I do not think she needs an ischemia evaluation. 2. To be seen by nephrology tomorrow with proteinuria she is at higher risk and I suspect she has hypertensive nephropathy her kidney function may transiently decrease but will improve and stabilize long-term with hypertension control especially with ARB.  obesity Body mass index is 37.9 kg/m.  Hypothyroidism -PO synthroid    Medical Consultants:    PCCM  Discharge Exam:   Vitals:   04/10/20 1929 04/11/20 0839  BP: (!) 160/64 (!) 155/72  Pulse: 70 64  Resp: 16   Temp: 98.7 F (37.1 C)   SpO2: 91%    Vitals:   04/10/20 0511 04/10/20 1137 04/10/20 1929 04/11/20 7782  BP: 128/66 (!) 137/59 (!) 160/64 (!) 155/72  Pulse: (!) 59 63 70 64  Resp: 20 18 16    Temp: 98 F (36.7 C) 99.3 F (37.4 C) 98.7 F (37.1 C)   TempSrc: Oral     SpO2: 94% 96% 91%   Weight:      Height:        General exam: Appears calm and comfortable.    The results of significant diagnostics from this hospitalization (including imaging, microbiology, ancillary and laboratory) are listed below for  reference.     Procedures and Diagnostic Studies:   DG Chest 2 View  Result Date: 04/05/2020 CLINICAL DATA:  Shortness of breath. EXAM: CHEST - 2 VIEW COMPARISON:  May 29, 2012 FINDINGS: Cardiomegaly. The hila and mediastinum are unremarkable. Bilateral interstitial opacities, most prominent the bases. No pneumothorax. No other acute abnormalities. IMPRESSION: Findings are most consistent with cardiomegaly and pulmonary edema. Electronically Signed   By: Gerome Sam III M.D   On: 04/05/2020 14:58   CT HEAD WO CONTRAST  Result Date: 04/05/2020 CLINICAL DATA:  Mental status change. EXAM: CT HEAD WITHOUT CONTRAST TECHNIQUE: Contiguous axial images were obtained from the base of the skull through the vertex without intravenous contrast. COMPARISON:  None. FINDINGS: Brain: No subdural, epidural, or subarachnoid hemorrhage. Ventricles and sulci are unremarkable. No acute cortical ischemia or infarct. No mass effect or midline shift. Basal gangliar calcifications are identified, of no acute significance. Cerebellum, brainstem, and basal cisterns are normal. Vascular: Calcified atherosclerosis in the intracranial carotids. Skull: Normal. Negative for fracture or focal lesion. Sinuses/Orbits: No acute finding. Other: None. IMPRESSION: No acute intracranial abnormalities. Electronically Signed   By: Gerome Sam III M.D   On: 04/05/2020 14:57   CT Angio Chest PE W and/or Wo Contrast  Result Date: 04/05/2020 CLINICAL DATA:  Hypoxia. Slurred speech. Possible pulmonary embolism. EXAM: CT ANGIOGRAPHY CHEST WITH CONTRAST TECHNIQUE: Multidetector CT imaging of the chest was performed using the standard protocol during bolus administration of intravenous contrast. Multiplanar CT image reconstructions and MIPs were obtained to evaluate the vascular anatomy. CONTRAST:  60mL OMNIPAQUE IOHEXOL 350 MG/ML SOLN COMPARISON:  CT abdomen 11/12/2013 FINDINGS: Cardiovascular: Mild cardiomegaly thoracic aorta is  normal in caliber. Pulmonary arterial system demonstrates no evidence of pulmonary emboli. Remaining vascular structures are unremarkable. Mediastinum/Nodes: 1.5 cm precarinal lymph no other significant mediastinal or hilar adenopathy. Remaining mediastinal structures are unremarkable. Lungs/Pleura: Lungs are somewhat hypoinflated demonstrate a small right pleural effusion. Mild hazy patchy density over the lung bases likely atelectasis and less likely infection. Subtle hazy perihilar density which may be due to edema. Airways are within normal. Upper Abdomen: A couple liver cysts are present unchanged. Musculoskeletal: Degenerative changes of the spine. Review of the MIP images confirms the above findings. IMPRESSION: 1. No evidence of pulmonary embolism. 2. Small right pleural effusion. Minimal patchy hazy density over the lung bases likely atelectasis and less likely infection. Subtle hazy perihilar density which may be due to edema. 3. Mild cardiomegaly. 4. Two liver cysts unchanged. Electronically Signed   By: Elberta Fortis M.D.   On: 04/05/2020 17:27   DG Chest Port 1 View  Result Date: 04/06/2020 CLINICAL DATA:  Respiratory failure EXAM: PORTABLE CHEST 1 VIEW COMPARISON:  April 05, 2020 FINDINGS: Stable cardiomegaly. The hila and mediastinum are unchanged. Dense left retrocardiac opacity is more pronounced in the interval. Diffuse interstitial opacities are identified. Mild atelectasis in the right base. No pneumothorax. No other changes. IMPRESSION: 1. Findings are most consistent  with cardiomegaly and mild edema. 2. There is also a dense left retrocardiac infiltrate which could represent atelectasis or pneumonia. This finding is more pronounced in the interval. Recommend clinical correlation and attention on follow-up. Electronically Signed   By: Gerome Sam III M.D   On: 04/06/2020 10:27   ECHOCARDIOGRAM COMPLETE  Result Date: 04/06/2020    ECHOCARDIOGRAM REPORT   Patient Name:   CEDRICA BRUNE   Date of Exam: 04/06/2020 Medical Rec #:  [id number]  Height:       66.0 in Accession #:    [id number] Weight:       235.0 lb Date of Birth:  [birth year] BSA:          2.142 m Patient Age:    74 years   BP:           110/58 mmHg Patient Gender: F          HR:           54 bpm. Exam Location:  Inpatient Procedure: 2D Echo, Color Doppler and Cardiac Doppler Indications:    R06.9 DOE  History:        Patient has no prior history of Echocardiogram examinations.                 Risk Factors:Hypertension, Dyslipidemia and Sleep Apnea.  Sonographer:    Irving Burton Senior RDCS Referring Phys: [phone] KATHRYN A WHITEHEART  Sonographer Comments: Technically difficult due to patient body habitus. Scanned upright on bi-pap. IMPRESSIONS  1. Left ventricular ejection fraction, by estimation, is 60 to 65%. The left ventricle has normal function. The left ventricle has no regional wall motion abnormalities. There is severe left ventricular hypertrophy. Left ventricular diastolic parameters  are indeterminate.  2. Right ventricular systolic function is normal. The right ventricular size is mildly enlarged. There is mildly elevated pulmonary artery systolic pressure. The estimated right ventricular systolic pressure is 40.7 mmHg.  3. Left atrial size was mildly dilated.  4. Right atrial size was severely dilated.  5. The mitral valve is normal in structure. No evidence of mitral valve regurgitation.  6. The aortic valve is tricuspid. Aortic valve regurgitation is trivial. No aortic stenosis is present.  7. Aortic dilatation noted. There is mild dilatation of the ascending aorta, measuring 37 mm.  8. The inferior vena cava is normal in size with <50% respiratory variability, suggesting right atrial pressure of 8 mmHg. FINDINGS  Left Ventricle: Left ventricular ejection fraction, by estimation, is 60 to 65%. The left ventricle has normal function. The left ventricle has no regional wall motion abnormalities. The left ventricular internal  cavity size was normal in size. There is  severe left ventricular hypertrophy. Left ventricular diastolic parameters are indeterminate. Right Ventricle: The right ventricular size is mildly enlarged. Right vetricular wall thickness was not assessed. Right ventricular systolic function is normal. There is mildly elevated pulmonary artery systolic pressure. The tricuspid regurgitant velocity is 2.86 m/s, and with an assumed right atrial pressure of 8 mmHg, the estimated right ventricular systolic pressure is 40.7 mmHg. Left Atrium: Left atrial size was mildly dilated. Right Atrium: Right atrial size was severely dilated. Pericardium: There is no evidence of pericardial effusion. Mitral Valve: The mitral valve is normal in structure. No evidence of mitral valve regurgitation. Tricuspid Valve: The tricuspid valve is normal in structure. Tricuspid valve regurgitation is trivial. Aortic Valve: The aortic valve is tricuspid. Aortic valve regurgitation is trivial. Aortic regurgitation PHT measures 641 msec. No aortic stenosis is present. Pulmonic  Valve: The pulmonic valve was not well visualized. Pulmonic valve regurgitation is not visualized. Aorta: The aortic root is normal in size and structure and aortic dilatation noted. There is mild dilatation of the ascending aorta, measuring 37 mm. Venous: The inferior vena cava is normal in size with less than 50% respiratory variability, suggesting right atrial pressure of 8 mmHg. IAS/Shunts: The interatrial septum was not well visualized.  LEFT VENTRICLE PLAX 2D LVIDd:         4.20 cm  Diastology LVIDs:         2.60 cm  LV e' medial:    6.85 cm/s LV PW:         1.60 cm  LV E/e' medial:  8.8 LV IVS:        1.30 cm  LV e' lateral:   3.59 cm/s LVOT diam:     2.20 cm  LV E/e' lateral: 16.8 LV SV:         66 LV SV Index:   31 LVOT Area:     3.80 cm  RIGHT VENTRICLE RV S prime:     16.10 cm/s TAPSE (M-mode): 2.1 cm LEFT ATRIUM             Index       RIGHT ATRIUM           Index LA  diam:        3.70 cm 1.73 cm/m  RA Area:     30.50 cm LA Vol (A2C):   86.2 ml 40.25 ml/m RA Volume:   107.00 ml 49.96 ml/m LA Vol (A4C):   58.4 ml 27.27 ml/m LA Biplane Vol: 72.1 ml 33.66 ml/m  AORTIC VALVE LVOT Vmax:   91.40 cm/s LVOT Vmean:  68.400 cm/s LVOT VTI:    0.173 m AI PHT:      641 msec  AORTA Ao Root diam: 3.70 cm Ao Asc diam:  3.70 cm MITRAL VALVE               TRICUSPID VALVE MV Area (PHT): 2.17 cm    TV Peak grad:   32.7 mmHg MV Decel Time: 349 msec    TV Vmax:        2.86 m/s MV E velocity: 60.40 cm/s  TR Peak grad:   32.7 mmHg MV A velocity: 54.40 cm/s  TR Vmax:        286.00 cm/s MV E/A ratio:  1.11                            SHUNTS                            Systemic VTI:  0.17 m                            Systemic Diam: 2.20 cm Epifanio Lesches MD Electronically signed by Epifanio Lesches MD Signature Date/Time: 04/06/2020/12:14:14 PM    Final      Labs:   Basic Metabolic Panel: Recent Labs  Lab 04/06/20 0342 04/06/20 1628 04/07/20 0054 04/07/20 0054 04/08/20 0043 04/08/20 0043 04/09/20 0158 04/09/20 0158 04/10/20 0244 04/11/20 0157  NA 138   < > 137  --  137  --  138  --  138 138  K 5.6*   < > 4.1   < > 4.6   < > 4.6   < >  5.2* 4.5  CL 104  --  100  --  99  --  101  --  101 102  CO2 27  --  29  --  24  --  27  --  29 25  GLUCOSE 105*  --  100*  --  120*  --  122*  --  122* 112*  BUN 20  --  39*  --  51*  --  48*  --  41* 40*  CREATININE 1.55*  --  2.94*  --  2.50*  --  1.84*  --  1.62* 1.51*  CALCIUM 9.3  --  9.0  --  9.4  --  9.3  --  9.4 9.4  MG 2.0  --  1.9  --   --   --   --   --   --   --   PHOS 5.0*  --  4.9*  --   --   --   --   --   --   --    < > = values in this interval not displayed.   GFR Estimated Creatinine Clearance: 39.1 mL/min (A) (by C-G formula based on SCr of 1.51 mg/dL (H)). Liver Function Tests: Recent Labs  Lab 04/05/20 1339 04/07/20 0054  AST 14*  --   ALT 15  --   ALKPHOS 100  --   BILITOT 0.4  --   PROT 7.8  --    ALBUMIN 3.2* 2.6*   No results for input(s): LIPASE, AMYLASE in the last 168 hours. Recent Labs  Lab 04/05/20 1509  AMMONIA 35   Coagulation profile Recent Labs  Lab 04/05/20 1339  INR 1.0    CBC: Recent Labs  Lab 04/05/20 1339 04/05/20 1616 04/06/20 0111 04/06/20 1750 04/07/20 0054 04/09/20 0158 04/10/20 0244  WBC 8.4  --  10.9*  --  7.9 7.9 6.9  NEUTROABS 6.8  --   --   --   --   --   --   HGB 13.7   < > 13.2 13.6 11.8* 12.3 11.4*  HCT 47.7*   < > 45.3 40.0 38.8 40.7 38.5  MCV 90.0  --  88.6  --  88.2 87.0 88.7  PLT 261  --  258  --  224 222 226   < > = values in this interval not displayed.   Cardiac Enzymes: No results for input(s): CKTOTAL, CKMB, CKMBINDEX, TROPONINI in the last 168 hours. BNP: Invalid input(s): POCBNP CBG: Recent Labs  Lab 04/10/20 0719 04/10/20 1132 04/10/20 1608 04/10/20 2023 04/11/20 0813  GLUCAP 105* 110* 129* 132* 100*   D-Dimer No results for input(s): DDIMER in the last 72 hours. Hgb A1c No results for input(s): HGBA1C in the last 72 hours. Lipid Profile No results for input(s): CHOL, HDL, LDLCALC, TRIG, CHOLHDL, LDLDIRECT in the last 72 hours. Thyroid function studies No results for input(s): TSH, T4TOTAL, T3FREE, THYROIDAB in the last 72 hours.  Invalid input(s): FREET3 Anemia work up No results for input(s): VITAMINB12, FOLATE, FERRITIN, TIBC, IRON, RETICCTPCT in the last 72 hours. Microbiology Recent Results (from the past 240 hour(s))  Respiratory Panel by RT PCR (Flu A&B, Covid) - Nasopharyngeal Swab     Status: None   Collection Time: 04/05/20  3:40 PM   Specimen: Nasopharyngeal Swab; Nasopharyngeal(NP) swabs in vial transport medium  Result Value Ref Range Status   SARS Coronavirus 2 by RT PCR NEGATIVE NEGATIVE Final    Comment: (NOTE) SARS-CoV-2 target nucleic acids  are NOT DETECTED.  The SARS-CoV-2 RNA is generally detectable in upper respiratoy specimens during the acute phase of infection. The  lowest concentration of SARS-CoV-2 viral copies this assay can detect is 131 copies/mL. A negative result does not preclude SARS-Cov-2 infection and should not be used as the sole basis for treatment or other patient management decisions. A negative result may occur with  improper specimen collection/handling, submission of specimen other than nasopharyngeal swab, presence of viral mutation(s) within the areas targeted by this assay, and inadequate number of viral copies (<131 copies/mL). A negative result must be combined with clinical observations, patient history, and epidemiological information. The expected result is Negative.  Fact Sheet for Patients:  https://www.moore.com/https://www.fda.gov/media/142436/download  Fact Sheet for Healthcare Providers:  https://www.young.biz/https://www.fda.gov/media/142435/download  This test is no t yet approved or cleared by the [place] FDA and  has been authorized for detection and/or diagnosis of SARS-CoV-2 by FDA under an Emergency Use Authorization (EUA). This EUA will remain  in effect (meaning this test can be used) for the duration of the COVID-19 declaration under Section 564(b)(1) of the Act, 21 U.S.C. section 360bbb-3(b)(1), unless the authorization is terminated or revoked sooner.     Influenza A by PCR NEGATIVE NEGATIVE Final   Influenza B by PCR NEGATIVE NEGATIVE Final    Comment: (NOTE) The Xpert Xpress SARS-CoV-2/FLU/RSV assay is intended as an aid in  the diagnosis of influenza from Nasopharyngeal swab specimens and  should not be used as a sole basis for treatment. Nasal washings and  aspirates are unacceptable for Xpert Xpress SARS-CoV-2/FLU/RSV  testing.  Fact Sheet for Patients: https://www.moore.com/https://www.fda.gov/media/142436/download  Fact Sheet for Healthcare Providers: https://www.young.biz/https://www.fda.gov/media/142435/download  This test is not yet approved or cleared by the [place] FDA and  has been authorized for detection and/or diagnosis of SARS-CoV-2 by  FDA under  an Emergency Use Authorization (EUA). This EUA will remain  in effect (meaning this test can be used) for the duration of the  Covid-19 declaration under Section 564(b)(1) of the Act, 21  U.S.C. section 360bbb-3(b)(1), unless the authorization is  terminated or revoked. Performed at [facility], [address] [place], [place] [address]   MRSA PCR Screening     Status: None   Collection Time: 04/05/20 10:51 PM   Specimen: Nasopharyngeal  Result Value Ref Range Status   MRSA by PCR NEGATIVE NEGATIVE Final    Comment:        The GeneXpert MRSA Assay (FDA approved for NASAL specimens only), is one component of a comprehensive MRSA colonization surveillance program. It is not intended to diagnose MRSA infection nor to guide or monitor treatment for MRSA infections. Performed at [facility], [address] [place], [place] [address]   Culture, blood (routine x 2)     Status: None (Preliminary result)   Collection Time: 04/06/20  9:00 AM   Specimen: BLOOD  Result Value Ref Range Status   Specimen Description BLOOD RIGHT ANTECUBITAL  Final   Special Requests   Final    BOTTLES DRAWN AEROBIC AND ANAEROBIC Blood Culture adequate volume   Culture   Final    NO GROWTH 4 DAYS Performed at [facility], [address] [place], [place] [address]    Report Status PENDING  Incomplete  Culture, blood (routine x 2)     Status: None (Preliminary result)   Collection Time: 04/06/20  9:03 AM   Specimen: BLOOD RIGHT HAND  Result Value Ref Range Status  Specimen Description BLOOD RIGHT HAND  Final   Special Requests   Final    BOTTLES DRAWN AEROBIC AND ANAEROBIC Blood Culture adequate volume   Culture   Final    NO GROWTH 4 DAYS Performed at [facility], [address], [place], [place] [address]    Report Status PENDING  Incomplete     Discharge Instructions:   Discharge Instructions    Diet - low sodium heart healthy   Complete by: As directed     Increase activity slowly   Complete by: As directed      Allergies as of 04/11/2020      Reactions   Aspirin Other (See Comments)   Heart Flutters   Pravastatin Nausea Only   Tramadol Other (See Comments)   Hallucinations       Medication List    STOP taking these medications   atenolol 50 MG tablet Commonly known as: TENORMIN   cloNIDine 0.1 MG tablet Commonly known as: CATAPRES     TAKE these medications   amLODipine 5 MG tablet Commonly known as: NORVASC Take 1 tablet (5 mg total) by mouth daily. Start taking on: April 12, 2020   atorvastatin 10 MG tablet Commonly known as: LIPITOR Take 10 mg by mouth daily.   carvedilol 12.5 MG tablet Commonly known as: COREG Take 1 tablet (12.5 mg total) by mouth 2 (two) times daily.   furosemide 40 MG tablet Commonly known as: LASIX Take 1 tablet (40 mg total) by mouth daily. Start taking on: April 12, 2020   levothyroxine 112 MCG tablet Commonly known as: SYNTHROID Take 112 mcg by mouth daily before breakfast.   loratadine 10 MG tablet Commonly known as: CLARITIN Take 10 mg by mouth daily as needed for allergies.   valsartan 80 MG tablet Commonly known as: DIOVAN Take 80 mg by mouth daily.            Durable Medical Equipment  (From admission, onward)         Start     Ordered   04/11/20 0927  For home use only DME continuous positive airway pressure (CPAP)  Once       Question Answer Comment  Length of Need Lifetime   Patient has OSA or probable OSA Yes   Is the patient currently using CPAP in the home No   If no (to question two) date of sleep study has been ordered by Pulmonary as an outpatient but presented to the hospital with ARF with hypoxia and hypercapnia due to untreated OSA   Settings Autotitration   Signs and symptoms of probable OSA  (select all that apply) Snoring   Signs and symptoms of probable OSA  (select all that apply) Moring headaches   Signs and symptoms of probable OSA   (select all that apply) Gasping during sleep   Signs and symptoms of probable OSA  (select all that apply) Witnessed apneas   CPAP supplies needed Mask, headgear, cushions, filters, heated tubing and water chamber      04/11/20 0927          Follow-up Information    Philemon Kingdom, MD Follow up in 1 week(s).   Specialty: Internal Medicine Why: bmp Contact information: [address] [place] [address] [phone]        Bufford Buttner, MD Follow up.   Specialty: Nephrology Contact information: [address] [place] [address] [phone]  Time coordinating discharge: 35 min  Signed:  Joseph Art DO  [facility] 04/11/2020, 10:41 AM

## 2020-04-11 NOTE — Progress Notes (Signed)
Physical Therapy Treatment Patient Details Name: Amanda Bauer MRN: [id number] DOB: 1945-06-24 Today's Date: 04/11/2020    History of Present Illness 74 year old female with prior history of former smoker, likely OSA not yet on CPAP (awaiting sleep study), CKD stage IIIb, AR, HTN, HLD, hypothyroidism, osteoarthritis, barrett esophagus, and hiatal hernia presenting from home 11/20 with shortness of breath and slurred speech. In ER had progressive confusion, drowsiness and dyspnea and was placed on bipap.  ABG with hypercarbic resp failure, BP remained 220/100.     PT Comments    Patient received in bed, very pleasant and cooperative with therapies. Able to ambulate in room with no device and distant S, did trial RW for longer distances in hallway however. Mobilized well but desats to 86% on room air with difficulty recovering- required 2LPM O2 to keep sats at >96% with activity; note she is able to maintain at 90-91% on room air at rest. Left sitting at EOB with all needs met, medical team aware of results of ambulatory oxygen assessment.     Follow Up Recommendations  Home health PT;Supervision - Intermittent     Equipment Recommendations  None recommended by PT    Recommendations for Other Services       Precautions / Restrictions Precautions Precautions: Fall;Other (comment) Precaution Comments: watch sats Restrictions Weight Bearing Restrictions: No    Mobility  Bed Mobility Overal bed mobility: Independent                Transfers Overall transfer level: Independent                  Ambulation/Gait Ambulation/Gait assistance: Modified independent (Device/Increase time) Gait Distance (Feet): 160 Feet (86f, 1046f Assistive device: Rolling walker (2 wheeled) Gait Pattern/deviations: Step-through pattern;Decreased stride length Gait velocity: WNL   General Gait Details: trialed RW today, able to use with Mod(I) and steady; desatted to 86% on room air, needed  2LPM to maintain 96% and greater   Stairs             Wheelchair Mobility    Modified Rankin (Stroke Patients Only)       Balance Overall balance assessment: Mild deficits observed, not formally tested                                          Cognition Arousal/Alertness: Awake/alert Behavior During Therapy: WFL for tasks assessed/performed Overall Cognitive Status: Within Functional Limits for tasks assessed                                        Exercises      General Comments        Pertinent Vitals/Pain Pain Assessment: No/denies pain    Home Living                      Prior Function            PT Goals (current goals can now be found in the care plan section) Acute Rehab PT Goals Patient Stated Goal: to go home PT Goal Formulation: With patient Time For Goal Achievement: 04/22/20 Potential to Achieve Goals: Good Progress towards PT goals: Progressing toward goals    Frequency    Min 3X/week      PT Plan Current plan  remains appropriate    Co-evaluation              AM-PAC PT "6 Clicks" Mobility   Outcome Measure  Help needed turning from your back to your side while in a flat bed without using bedrails?: None   Help needed moving to and from a bed to a chair (including a wheelchair)?: None Help needed standing up from a chair using your arms (e.g., wheelchair or bedside chair)?: A Little Help needed to walk in hospital room?: A Little Help needed climbing 3-5 steps with a railing? : A Little 6 Click Score: 17    End of Session Equipment Utilized During Treatment: Oxygen Activity Tolerance: Patient tolerated treatment well Patient left: in bed;with call bell/phone within reach Nurse Communication: Mobility status PT Visit Diagnosis: Muscle weakness (generalized) (M62.81)     Time: 9326-7124 PT Time Calculation (min) (ACUTE ONLY): 28 min  Charges:  $Gait Training: 8-22  mins $Therapeutic Activity: 8-22 mins                     Windell Norfolk, DPT, PN1   Supplemental Physical Therapist [facility]    Pager [phone] Acute Rehab Office [phone]

## 2020-04-11 NOTE — Progress Notes (Signed)

## 2020-04-11 NOTE — Progress Notes (Signed)
Sats at rest: 94 % in RA Exertion: de-sats down to 85  Pt able to recover quickly back into mid 90s on [facility]

## 2020-04-11 NOTE — Progress Notes (Signed)
Please see in addition to PT note:   SATURATION QUALIFICATIONS: (This note is used to comply with regulatory documentation for home oxygen)  Patient Saturations on Room Air at Rest = 90%  Patient Saturations on Room Air while Ambulating = 86%  Patient Saturations on 2 Liters of oxygen while Ambulating = 96%  Please briefly explain why patient needs home oxygen:desaturation into 80s with activity on room air  Madelaine Etienne, DPT, PN1   Supplemental Physical Therapist [facility]    Pager [phone] Acute Rehab Office [phone]

## 2020-04-12 LAB — GLUCOSE, CAPILLARY
Glucose-Capillary: 111 mg/dL — ABNORMAL HIGH (ref 70–99)
Glucose-Capillary: 103 mg/dL — ABNORMAL HIGH (ref 70–99)

## 2020-04-12 NOTE — TOC Transition Note (Signed)
Transition of Care [facility]) - CM/SW Discharge Note   Patient Details  Name: Amanda Bauer MRN: [id number] Date of Birth: 12-Jan-1946  Transition of Care [facility]) CM/SW Contact:  Bess Kinds, RN Phone Number: [phone] 04/12/2020, 10:28 AM   Clinical Narrative:     Patient to transition home today. Spoke with daughter, Carollee Herter, on her mobile phone. She was at patient bedside at time of call getting discharge instructions from the nurse. [employer] is scheduled for 2pm delivery of oxygen and NIV. Oxygen for transportation home is at bedside. [facility] notified of transition home today - HH orders in place. No further TOC needs identified.   Final next level of care: Home w Home Health Services Barriers to Discharge: No Barriers Identified   Patient Goals and CMS Choice Patient states their goals for this hospitalization and ongoing recovery are:: home with home health CMS Medicare.gov Compare Post Acute Care list provided to:: Patient Represenative (must comment) Choice offered to / list presented to : Adult Children  Discharge Placement                       Discharge Plan and Services                DME Arranged: Oxygen, Other see comment (Trilogy) DME Agency: [employer] Date DME Agency Contacted: 04/11/20   Representative spoke with at DME Agency: Velna Hatchet HH Arranged: PT HH Agency: [facility] Date [facility] Agency Contacted: 04/12/20 Time HH Agency Contacted: 1028 Representative spoke with at [facility] Agency: Kandee Keen  Social Determinants of Health (SDOH) Interventions     Readmission Risk Interventions No flowsheet data found.

## 2020-04-12 NOTE — Progress Notes (Signed)
Patient's d/c held on 11/26 due to transportation. JV

## 2020-04-14 ENCOUNTER — Other Ambulatory Visit: Payer: Self-pay | Admitting: *Deleted

## 2020-04-14 ENCOUNTER — Ambulatory Visit: Payer: Medicare PPO

## 2020-04-14 ENCOUNTER — Other Ambulatory Visit: Payer: Self-pay

## 2020-04-14 DIAGNOSIS — G4733 Obstructive sleep apnea (adult) (pediatric): Secondary | ICD-10-CM

## 2020-04-17 DIAGNOSIS — N179 Acute kidney failure, unspecified: Secondary | ICD-10-CM | POA: Diagnosis not present

## 2020-04-17 DIAGNOSIS — I351 Nonrheumatic aortic (valve) insufficiency: Secondary | ICD-10-CM | POA: Diagnosis not present

## 2020-04-17 DIAGNOSIS — I501 Left ventricular failure: Secondary | ICD-10-CM | POA: Diagnosis not present

## 2020-04-17 DIAGNOSIS — K2270 Barrett's esophagus without dysplasia: Secondary | ICD-10-CM | POA: Diagnosis not present

## 2020-04-17 DIAGNOSIS — N1832 Chronic kidney disease, stage 3b: Secondary | ICD-10-CM | POA: Diagnosis not present

## 2020-04-17 DIAGNOSIS — I5033 Acute on chronic diastolic (congestive) heart failure: Secondary | ICD-10-CM | POA: Diagnosis not present

## 2020-04-17 DIAGNOSIS — I130 Hypertensive heart and chronic kidney disease with heart failure and stage 1 through stage 4 chronic kidney disease, or unspecified chronic kidney disease: Secondary | ICD-10-CM | POA: Diagnosis not present

## 2020-04-17 DIAGNOSIS — J9601 Acute respiratory failure with hypoxia: Secondary | ICD-10-CM | POA: Diagnosis not present

## 2020-04-17 DIAGNOSIS — G4733 Obstructive sleep apnea (adult) (pediatric): Secondary | ICD-10-CM | POA: Diagnosis not present

## 2020-04-18 DIAGNOSIS — Z6837 Body mass index (BMI) 37.0-37.9, adult: Secondary | ICD-10-CM | POA: Diagnosis not present

## 2020-04-18 DIAGNOSIS — N1832 Chronic kidney disease, stage 3b: Secondary | ICD-10-CM | POA: Diagnosis not present

## 2020-04-18 DIAGNOSIS — I5032 Chronic diastolic (congestive) heart failure: Secondary | ICD-10-CM | POA: Diagnosis not present

## 2020-04-18 DIAGNOSIS — I10 Essential (primary) hypertension: Secondary | ICD-10-CM | POA: Diagnosis not present

## 2020-04-18 DIAGNOSIS — E039 Hypothyroidism, unspecified: Secondary | ICD-10-CM | POA: Diagnosis not present

## 2020-04-18 DIAGNOSIS — J9610 Chronic respiratory failure, unspecified whether with hypoxia or hypercapnia: Secondary | ICD-10-CM | POA: Diagnosis not present

## 2020-04-18 DIAGNOSIS — J9612 Chronic respiratory failure with hypercapnia: Secondary | ICD-10-CM | POA: Diagnosis not present

## 2020-04-18 DIAGNOSIS — E662 Morbid (severe) obesity with alveolar hypoventilation: Secondary | ICD-10-CM | POA: Diagnosis not present

## 2020-04-18 DIAGNOSIS — E785 Hyperlipidemia, unspecified: Secondary | ICD-10-CM | POA: Diagnosis not present

## 2020-04-18 DIAGNOSIS — Z79899 Other long term (current) drug therapy: Secondary | ICD-10-CM | POA: Diagnosis not present

## 2020-04-21 DIAGNOSIS — I501 Left ventricular failure: Secondary | ICD-10-CM | POA: Diagnosis not present

## 2020-04-21 DIAGNOSIS — I5033 Acute on chronic diastolic (congestive) heart failure: Secondary | ICD-10-CM | POA: Diagnosis not present

## 2020-04-21 DIAGNOSIS — I130 Hypertensive heart and chronic kidney disease with heart failure and stage 1 through stage 4 chronic kidney disease, or unspecified chronic kidney disease: Secondary | ICD-10-CM | POA: Diagnosis not present

## 2020-04-21 DIAGNOSIS — G4733 Obstructive sleep apnea (adult) (pediatric): Secondary | ICD-10-CM | POA: Diagnosis not present

## 2020-04-21 DIAGNOSIS — J9601 Acute respiratory failure with hypoxia: Secondary | ICD-10-CM | POA: Diagnosis not present

## 2020-04-21 DIAGNOSIS — N179 Acute kidney failure, unspecified: Secondary | ICD-10-CM | POA: Diagnosis not present

## 2020-04-21 DIAGNOSIS — I351 Nonrheumatic aortic (valve) insufficiency: Secondary | ICD-10-CM | POA: Diagnosis not present

## 2020-04-21 DIAGNOSIS — N1832 Chronic kidney disease, stage 3b: Secondary | ICD-10-CM | POA: Diagnosis not present

## 2020-04-21 DIAGNOSIS — K2270 Barrett's esophagus without dysplasia: Secondary | ICD-10-CM | POA: Diagnosis not present

## 2020-04-24 ENCOUNTER — Encounter (HOSPITAL_BASED_OUTPATIENT_CLINIC_OR_DEPARTMENT_OTHER): Payer: Medicare PPO | Admitting: Pulmonary Disease

## 2020-04-24 DIAGNOSIS — I5033 Acute on chronic diastolic (congestive) heart failure: Secondary | ICD-10-CM | POA: Diagnosis not present

## 2020-04-24 DIAGNOSIS — I351 Nonrheumatic aortic (valve) insufficiency: Secondary | ICD-10-CM | POA: Diagnosis not present

## 2020-04-24 DIAGNOSIS — K2270 Barrett's esophagus without dysplasia: Secondary | ICD-10-CM | POA: Diagnosis not present

## 2020-04-24 DIAGNOSIS — N179 Acute kidney failure, unspecified: Secondary | ICD-10-CM | POA: Diagnosis not present

## 2020-04-24 DIAGNOSIS — N1832 Chronic kidney disease, stage 3b: Secondary | ICD-10-CM | POA: Diagnosis not present

## 2020-04-24 DIAGNOSIS — G4733 Obstructive sleep apnea (adult) (pediatric): Secondary | ICD-10-CM | POA: Diagnosis not present

## 2020-04-24 DIAGNOSIS — I501 Left ventricular failure: Secondary | ICD-10-CM | POA: Diagnosis not present

## 2020-04-24 DIAGNOSIS — I130 Hypertensive heart and chronic kidney disease with heart failure and stage 1 through stage 4 chronic kidney disease, or unspecified chronic kidney disease: Secondary | ICD-10-CM | POA: Diagnosis not present

## 2020-04-24 DIAGNOSIS — J9601 Acute respiratory failure with hypoxia: Secondary | ICD-10-CM | POA: Diagnosis not present

## 2020-04-25 ENCOUNTER — Ambulatory Visit (HOSPITAL_BASED_OUTPATIENT_CLINIC_OR_DEPARTMENT_OTHER): Payer: Medicare PPO | Attending: Pulmonary Disease | Admitting: Pulmonary Disease

## 2020-04-25 ENCOUNTER — Other Ambulatory Visit: Payer: Self-pay

## 2020-04-25 DIAGNOSIS — G4733 Obstructive sleep apnea (adult) (pediatric): Secondary | ICD-10-CM | POA: Insufficient documentation

## 2020-05-01 DIAGNOSIS — I129 Hypertensive chronic kidney disease with stage 1 through stage 4 chronic kidney disease, or unspecified chronic kidney disease: Secondary | ICD-10-CM | POA: Diagnosis not present

## 2020-05-01 DIAGNOSIS — G4730 Sleep apnea, unspecified: Secondary | ICD-10-CM | POA: Diagnosis not present

## 2020-05-01 DIAGNOSIS — J9610 Chronic respiratory failure, unspecified whether with hypoxia or hypercapnia: Secondary | ICD-10-CM | POA: Diagnosis not present

## 2020-05-01 DIAGNOSIS — I5032 Chronic diastolic (congestive) heart failure: Secondary | ICD-10-CM | POA: Diagnosis not present

## 2020-05-01 DIAGNOSIS — N1832 Chronic kidney disease, stage 3b: Secondary | ICD-10-CM | POA: Diagnosis not present

## 2020-05-02 DIAGNOSIS — N1832 Chronic kidney disease, stage 3b: Secondary | ICD-10-CM | POA: Diagnosis not present

## 2020-05-02 DIAGNOSIS — K2270 Barrett's esophagus without dysplasia: Secondary | ICD-10-CM | POA: Diagnosis not present

## 2020-05-02 DIAGNOSIS — J9601 Acute respiratory failure with hypoxia: Secondary | ICD-10-CM | POA: Diagnosis not present

## 2020-05-02 DIAGNOSIS — N179 Acute kidney failure, unspecified: Secondary | ICD-10-CM | POA: Diagnosis not present

## 2020-05-02 DIAGNOSIS — I501 Left ventricular failure: Secondary | ICD-10-CM | POA: Diagnosis not present

## 2020-05-02 DIAGNOSIS — I351 Nonrheumatic aortic (valve) insufficiency: Secondary | ICD-10-CM | POA: Diagnosis not present

## 2020-05-02 DIAGNOSIS — G4733 Obstructive sleep apnea (adult) (pediatric): Secondary | ICD-10-CM | POA: Diagnosis not present

## 2020-05-02 DIAGNOSIS — I130 Hypertensive heart and chronic kidney disease with heart failure and stage 1 through stage 4 chronic kidney disease, or unspecified chronic kidney disease: Secondary | ICD-10-CM | POA: Diagnosis not present

## 2020-05-02 DIAGNOSIS — I5033 Acute on chronic diastolic (congestive) heart failure: Secondary | ICD-10-CM | POA: Diagnosis not present

## 2020-05-06 ENCOUNTER — Telehealth: Payer: Self-pay | Admitting: Pulmonary Disease

## 2020-05-06 DIAGNOSIS — I130 Hypertensive heart and chronic kidney disease with heart failure and stage 1 through stage 4 chronic kidney disease, or unspecified chronic kidney disease: Secondary | ICD-10-CM | POA: Diagnosis not present

## 2020-05-06 DIAGNOSIS — N179 Acute kidney failure, unspecified: Secondary | ICD-10-CM | POA: Diagnosis not present

## 2020-05-06 DIAGNOSIS — I5033 Acute on chronic diastolic (congestive) heart failure: Secondary | ICD-10-CM | POA: Diagnosis not present

## 2020-05-06 DIAGNOSIS — J9601 Acute respiratory failure with hypoxia: Secondary | ICD-10-CM | POA: Diagnosis not present

## 2020-05-06 DIAGNOSIS — I351 Nonrheumatic aortic (valve) insufficiency: Secondary | ICD-10-CM | POA: Diagnosis not present

## 2020-05-06 DIAGNOSIS — G4733 Obstructive sleep apnea (adult) (pediatric): Secondary | ICD-10-CM | POA: Diagnosis not present

## 2020-05-06 DIAGNOSIS — N1832 Chronic kidney disease, stage 3b: Secondary | ICD-10-CM | POA: Diagnosis not present

## 2020-05-06 DIAGNOSIS — I501 Left ventricular failure: Secondary | ICD-10-CM | POA: Diagnosis not present

## 2020-05-06 DIAGNOSIS — K2270 Barrett's esophagus without dysplasia: Secondary | ICD-10-CM | POA: Diagnosis not present

## 2020-05-06 NOTE — Telephone Encounter (Signed)
Called and spoke to pt's daughter, Carollee Herter (on [place]). Informed her of the results and recs per Dr. Wynona Neat. Order placed for CPAP. Appt scheduled for 06/2020. Shanon verbalized understanding and denied any further questions or concerns at this time.

## 2020-05-06 NOTE — Telephone Encounter (Signed)
Call patient  Sleep study result  Date of study: 04/25/2020  Impression: Moderate obstructive sleep apnea  Recommendation: DME referral  Recommend CPAP therapy for moderate obstructive sleep apnea  Trial of CPAP therapy on 13 cm H2O with a Small size Fisher&Paykel Full Face Mask Simplus mask and heated humidification, with 2L oxygen piped in to system.  Encourage weight loss measures  Follow-up in the office 4 to 6 weeks following initiation of treatment

## 2020-05-06 NOTE — Procedures (Signed)
POLYSOMNOGRAPHY  Last, First: Amanda, Bauer MRN: [id number] Gender: Female Age (years): 61 Weight (lbs): 235 DOB: [birth year] BMI: 37 Primary Care: No PCP Epworth Score: 0 Referring: Tomma Lightning MD Technician: Rolene Arbour Interpreting: Tomma Lightning MD Study Type: Split Night CPAP Ordered Study Type: Split Night CPAP Study date: 04/25/2020 Location: [place] CLINICAL INFORMATION Amanda Bauer is a 74 year old Female and was referred to the sleep center for evaluation of N/A. Indications include N/A.  MEDICATIONS Patient self administered medications include: N/A. Medications administered during study include No sleep medicine administered.  SLEEP STUDY TECHNIQUE The patient underwent an attended overnight level one polysomnography titration to assess the effects of CPAP therapy. The following variables were monitored: EEG (C4-A1, C3-A2, O1-A2, O2-A1), EOG, submental and leg EMG, ECG, oxyhemoglobin saturation by pulse oximetry, thoracic and abdominal respiratory effort belts, nasal/oral airflow by pressure sensor, body position sensor and snoring sensor. CPAP pressure was titrated to eliminate apneas, hypopneas and oxygen desaturation. Hypopneas were scored per AASM definition IB (4% desaturation)  The NPSG portion of the study ended at 1:30:11 AM . The CPAP titration was initiated at 1:34:09 AM AM with the CPAP portion of the study ending at 4:41:53 AM.  TECHNICIAN COMMENTS Comments added by Technician: NONE Comments added by Scorer: N/A SLEEP ARCHITECTURE The recording time for the entire night was 389.2 minutes. The diagnostic portion was initiated at 10:12:39 PM and terminated at 1:30:11 AM. The time in bed was 197.5 minutes. EEG confirmed total sleep time was 125.5 minutes yielding a sleep efficiency of 63.5%%. Sleep onset after lights out was 66.9 minutes with a REM latency of N/A minutes. The patient spent 0.0%% of the night in stage N1 sleep, 100.0%% in stage N2  sleep, 0.0%% in stage N3 and 0% in REM. The Arousal Index was 7.2/hour.  The titration portion was initiated at 1:34:09 AM and terminated at 4:41:53 AM. The time in bed was 187.7 minutes. EEG confirmed total sleep time was 167.3 minutes yielding a sleep efficiency of 89.1%%. Sleep onset after CPAP initiation was 2.4 minutes with a REM latency of 69.0 minutes. The patient spent 0.3%% of the night in stage N1 sleep, 76.7%% in stage N2 sleep, 0.0%% in stage N3 and 23% in REM. The Arousal Index was 1.1/hour. RESPIRATORY PARAMETERS During the diagnostic portion, there were a total of 46 respiratory disturbances recorded; 0 apneas ( 0 obstructive, 0 mixed, 0 central), 43 hypopneas and 3 RERAs. The apnea/hypopnea index 20.6 was events/hour and the RDI was 22.0 events/hour. The central sleep apnea index was 0.0 events/hour. The REM AHI was N/A /h and NREM AHI was 20.6/h. The REM RDI was 0.0 /h and NREM RDI was 22.0 /h. The supine AHI was 20.6/h, and the non supine AHI was 0/h; supine during 100.0%% of sleep. The supine RDI was 22.0/h, and the non supine RDI was N/A/h. Respiratory disturbances were associated with oxygen desaturation down to a nadir of 69.0 % during sleep. The mean oxygen saturation during the study was 91.9%. The cumulative time under 88% oxygen saturation was 27.7 minutes.  During the titration portion, the apnea/hypopnea index (AHI) was 2.2 events/hour and the RDI was 3.2 events/hour. The central sleep apnea index was events/hour. The most appropriate setting of CPAP was IPAP/EPAP 13/13 cm H2O. At this setting, the sleep efficiency was 89% and the patient was supine for 57%. The AHI was 0.0 events per hour(with 0 central events). Oxygen nadir was 84.0. LEG MOVEMENT DATA The periodic limb movement index  was 0.0/hour with an associated arousal index of /hour. CARDIAC DATA The underlying cardiac rhythm was most consistent with sinus rhythm. Mean heart rate was 61.7 during diagnostic portion and  57.7 during titration portion of study. Additional rhythm abnormalities include None.   IMPRESSIONS - Moderate Obstructive Sleep apnea(OSA) Optimal pressure attained. - EKG showed no cardiac abnormalities. - No Significant Central Sleep Apnea (CSA) - Severe Oxygen Desaturation - The patient snored with soft snoring volume. - EEG did not show alpha intrusion. - No significant periodic leg movements(PLMs) during sleep. However, no significant associated arousals. - Reduced sleep efficiency, long primary sleep latency, no REM sleep latency and no slow wave latency.   DIAGNOSIS - Obstructive Sleep Apnea (G47.33)   RECOMMENDATIONS - Trial of CPAP therapy on 13 cm H2O with a Small size Fisher&Paykel Full Face Mask Simplus mask and heated humidification, with 2L oxygen piped in to system. - Avoid alcohol, sedatives and other CNS depressants that may worsen sleep apnea and disrupt normal sleep architecture. - Sleep hygiene should be reviewed to assess factors that may improve sleep quality. - Weight management and regular exercise should be initiated or continued. - Return to Sleep Center for re-evaluation after 4 weeks of therapy  [Electronically signed] 05/06/2020 07:41 AM  Virl Diamond MD NPI: [id number]

## 2020-05-07 DIAGNOSIS — N179 Acute kidney failure, unspecified: Secondary | ICD-10-CM | POA: Diagnosis not present

## 2020-05-07 DIAGNOSIS — I501 Left ventricular failure: Secondary | ICD-10-CM | POA: Diagnosis not present

## 2020-05-07 DIAGNOSIS — K2270 Barrett's esophagus without dysplasia: Secondary | ICD-10-CM | POA: Diagnosis not present

## 2020-05-07 DIAGNOSIS — I351 Nonrheumatic aortic (valve) insufficiency: Secondary | ICD-10-CM | POA: Diagnosis not present

## 2020-05-07 DIAGNOSIS — I5033 Acute on chronic diastolic (congestive) heart failure: Secondary | ICD-10-CM | POA: Diagnosis not present

## 2020-05-07 DIAGNOSIS — G4733 Obstructive sleep apnea (adult) (pediatric): Secondary | ICD-10-CM | POA: Diagnosis not present

## 2020-05-07 DIAGNOSIS — J9601 Acute respiratory failure with hypoxia: Secondary | ICD-10-CM | POA: Diagnosis not present

## 2020-05-07 DIAGNOSIS — I130 Hypertensive heart and chronic kidney disease with heart failure and stage 1 through stage 4 chronic kidney disease, or unspecified chronic kidney disease: Secondary | ICD-10-CM | POA: Diagnosis not present

## 2020-05-07 DIAGNOSIS — N1832 Chronic kidney disease, stage 3b: Secondary | ICD-10-CM | POA: Diagnosis not present

## 2020-05-08 DIAGNOSIS — I501 Left ventricular failure: Secondary | ICD-10-CM | POA: Diagnosis not present

## 2020-05-08 DIAGNOSIS — I351 Nonrheumatic aortic (valve) insufficiency: Secondary | ICD-10-CM | POA: Diagnosis not present

## 2020-05-08 DIAGNOSIS — I130 Hypertensive heart and chronic kidney disease with heart failure and stage 1 through stage 4 chronic kidney disease, or unspecified chronic kidney disease: Secondary | ICD-10-CM | POA: Diagnosis not present

## 2020-05-08 DIAGNOSIS — N1832 Chronic kidney disease, stage 3b: Secondary | ICD-10-CM | POA: Diagnosis not present

## 2020-05-08 DIAGNOSIS — J9601 Acute respiratory failure with hypoxia: Secondary | ICD-10-CM | POA: Diagnosis not present

## 2020-05-08 DIAGNOSIS — I5033 Acute on chronic diastolic (congestive) heart failure: Secondary | ICD-10-CM | POA: Diagnosis not present

## 2020-05-08 DIAGNOSIS — N179 Acute kidney failure, unspecified: Secondary | ICD-10-CM | POA: Diagnosis not present

## 2020-05-08 DIAGNOSIS — K2270 Barrett's esophagus without dysplasia: Secondary | ICD-10-CM | POA: Diagnosis not present

## 2020-05-08 DIAGNOSIS — G4733 Obstructive sleep apnea (adult) (pediatric): Secondary | ICD-10-CM | POA: Diagnosis not present

## 2020-05-11 DIAGNOSIS — K2270 Barrett's esophagus without dysplasia: Secondary | ICD-10-CM | POA: Diagnosis not present

## 2020-05-11 DIAGNOSIS — J9600 Acute respiratory failure, unspecified whether with hypoxia or hypercapnia: Secondary | ICD-10-CM | POA: Diagnosis not present

## 2020-05-12 DIAGNOSIS — I130 Hypertensive heart and chronic kidney disease with heart failure and stage 1 through stage 4 chronic kidney disease, or unspecified chronic kidney disease: Secondary | ICD-10-CM | POA: Diagnosis not present

## 2020-05-12 DIAGNOSIS — J9601 Acute respiratory failure with hypoxia: Secondary | ICD-10-CM | POA: Diagnosis not present

## 2020-05-12 DIAGNOSIS — I501 Left ventricular failure: Secondary | ICD-10-CM | POA: Diagnosis not present

## 2020-05-12 DIAGNOSIS — K2270 Barrett's esophagus without dysplasia: Secondary | ICD-10-CM | POA: Diagnosis not present

## 2020-05-12 DIAGNOSIS — I5033 Acute on chronic diastolic (congestive) heart failure: Secondary | ICD-10-CM | POA: Diagnosis not present

## 2020-05-12 DIAGNOSIS — N1832 Chronic kidney disease, stage 3b: Secondary | ICD-10-CM | POA: Diagnosis not present

## 2020-05-12 DIAGNOSIS — I351 Nonrheumatic aortic (valve) insufficiency: Secondary | ICD-10-CM | POA: Diagnosis not present

## 2020-05-12 DIAGNOSIS — N179 Acute kidney failure, unspecified: Secondary | ICD-10-CM | POA: Diagnosis not present

## 2020-05-12 DIAGNOSIS — G4733 Obstructive sleep apnea (adult) (pediatric): Secondary | ICD-10-CM | POA: Diagnosis not present

## 2020-05-13 DIAGNOSIS — K2270 Barrett's esophagus without dysplasia: Secondary | ICD-10-CM | POA: Diagnosis not present

## 2020-05-13 DIAGNOSIS — I130 Hypertensive heart and chronic kidney disease with heart failure and stage 1 through stage 4 chronic kidney disease, or unspecified chronic kidney disease: Secondary | ICD-10-CM | POA: Diagnosis not present

## 2020-05-13 DIAGNOSIS — I5033 Acute on chronic diastolic (congestive) heart failure: Secondary | ICD-10-CM | POA: Diagnosis not present

## 2020-05-13 DIAGNOSIS — N1832 Chronic kidney disease, stage 3b: Secondary | ICD-10-CM | POA: Diagnosis not present

## 2020-05-13 DIAGNOSIS — I351 Nonrheumatic aortic (valve) insufficiency: Secondary | ICD-10-CM | POA: Diagnosis not present

## 2020-05-13 DIAGNOSIS — J9601 Acute respiratory failure with hypoxia: Secondary | ICD-10-CM | POA: Diagnosis not present

## 2020-05-13 DIAGNOSIS — I501 Left ventricular failure: Secondary | ICD-10-CM | POA: Diagnosis not present

## 2020-05-13 DIAGNOSIS — G4733 Obstructive sleep apnea (adult) (pediatric): Secondary | ICD-10-CM | POA: Diagnosis not present

## 2020-05-13 DIAGNOSIS — N179 Acute kidney failure, unspecified: Secondary | ICD-10-CM | POA: Diagnosis not present

## 2020-05-14 DIAGNOSIS — H2513 Age-related nuclear cataract, bilateral: Secondary | ICD-10-CM | POA: Diagnosis not present

## 2020-05-14 DIAGNOSIS — H43391 Other vitreous opacities, right eye: Secondary | ICD-10-CM | POA: Diagnosis not present

## 2020-05-19 DIAGNOSIS — J9610 Chronic respiratory failure, unspecified whether with hypoxia or hypercapnia: Secondary | ICD-10-CM | POA: Diagnosis not present

## 2020-05-19 DIAGNOSIS — E662 Morbid (severe) obesity with alveolar hypoventilation: Secondary | ICD-10-CM | POA: Diagnosis not present

## 2020-05-21 DIAGNOSIS — J9601 Acute respiratory failure with hypoxia: Secondary | ICD-10-CM | POA: Diagnosis not present

## 2020-05-21 DIAGNOSIS — K2270 Barrett's esophagus without dysplasia: Secondary | ICD-10-CM | POA: Diagnosis not present

## 2020-05-21 DIAGNOSIS — G4733 Obstructive sleep apnea (adult) (pediatric): Secondary | ICD-10-CM | POA: Diagnosis not present

## 2020-05-21 DIAGNOSIS — I501 Left ventricular failure: Secondary | ICD-10-CM | POA: Diagnosis not present

## 2020-05-21 DIAGNOSIS — I351 Nonrheumatic aortic (valve) insufficiency: Secondary | ICD-10-CM | POA: Diagnosis not present

## 2020-05-21 DIAGNOSIS — I5033 Acute on chronic diastolic (congestive) heart failure: Secondary | ICD-10-CM | POA: Diagnosis not present

## 2020-05-21 DIAGNOSIS — N1832 Chronic kidney disease, stage 3b: Secondary | ICD-10-CM | POA: Diagnosis not present

## 2020-05-21 DIAGNOSIS — I130 Hypertensive heart and chronic kidney disease with heart failure and stage 1 through stage 4 chronic kidney disease, or unspecified chronic kidney disease: Secondary | ICD-10-CM | POA: Diagnosis not present

## 2020-05-21 DIAGNOSIS — N179 Acute kidney failure, unspecified: Secondary | ICD-10-CM | POA: Diagnosis not present

## 2020-05-23 DIAGNOSIS — I501 Left ventricular failure: Secondary | ICD-10-CM | POA: Diagnosis not present

## 2020-05-23 DIAGNOSIS — I5033 Acute on chronic diastolic (congestive) heart failure: Secondary | ICD-10-CM | POA: Diagnosis not present

## 2020-05-23 DIAGNOSIS — N1832 Chronic kidney disease, stage 3b: Secondary | ICD-10-CM | POA: Diagnosis not present

## 2020-05-23 DIAGNOSIS — I130 Hypertensive heart and chronic kidney disease with heart failure and stage 1 through stage 4 chronic kidney disease, or unspecified chronic kidney disease: Secondary | ICD-10-CM | POA: Diagnosis not present

## 2020-05-23 DIAGNOSIS — I351 Nonrheumatic aortic (valve) insufficiency: Secondary | ICD-10-CM | POA: Diagnosis not present

## 2020-05-23 DIAGNOSIS — J9601 Acute respiratory failure with hypoxia: Secondary | ICD-10-CM | POA: Diagnosis not present

## 2020-05-23 DIAGNOSIS — K2270 Barrett's esophagus without dysplasia: Secondary | ICD-10-CM | POA: Diagnosis not present

## 2020-05-23 DIAGNOSIS — G4733 Obstructive sleep apnea (adult) (pediatric): Secondary | ICD-10-CM | POA: Diagnosis not present

## 2020-05-23 DIAGNOSIS — N179 Acute kidney failure, unspecified: Secondary | ICD-10-CM | POA: Diagnosis not present

## 2020-05-27 DIAGNOSIS — I130 Hypertensive heart and chronic kidney disease with heart failure and stage 1 through stage 4 chronic kidney disease, or unspecified chronic kidney disease: Secondary | ICD-10-CM | POA: Diagnosis not present

## 2020-05-27 DIAGNOSIS — N1832 Chronic kidney disease, stage 3b: Secondary | ICD-10-CM | POA: Diagnosis not present

## 2020-05-27 DIAGNOSIS — J9601 Acute respiratory failure with hypoxia: Secondary | ICD-10-CM | POA: Diagnosis not present

## 2020-05-27 DIAGNOSIS — I501 Left ventricular failure: Secondary | ICD-10-CM | POA: Diagnosis not present

## 2020-05-27 DIAGNOSIS — K2270 Barrett's esophagus without dysplasia: Secondary | ICD-10-CM | POA: Diagnosis not present

## 2020-05-27 DIAGNOSIS — I5033 Acute on chronic diastolic (congestive) heart failure: Secondary | ICD-10-CM | POA: Diagnosis not present

## 2020-05-27 DIAGNOSIS — G4733 Obstructive sleep apnea (adult) (pediatric): Secondary | ICD-10-CM | POA: Diagnosis not present

## 2020-05-27 DIAGNOSIS — I351 Nonrheumatic aortic (valve) insufficiency: Secondary | ICD-10-CM | POA: Diagnosis not present

## 2020-05-27 DIAGNOSIS — N179 Acute kidney failure, unspecified: Secondary | ICD-10-CM | POA: Diagnosis not present

## 2020-05-30 DIAGNOSIS — J9601 Acute respiratory failure with hypoxia: Secondary | ICD-10-CM | POA: Diagnosis not present

## 2020-05-30 DIAGNOSIS — I5033 Acute on chronic diastolic (congestive) heart failure: Secondary | ICD-10-CM | POA: Diagnosis not present

## 2020-05-30 DIAGNOSIS — G4733 Obstructive sleep apnea (adult) (pediatric): Secondary | ICD-10-CM | POA: Diagnosis not present

## 2020-05-30 DIAGNOSIS — I130 Hypertensive heart and chronic kidney disease with heart failure and stage 1 through stage 4 chronic kidney disease, or unspecified chronic kidney disease: Secondary | ICD-10-CM | POA: Diagnosis not present

## 2020-05-30 DIAGNOSIS — K2270 Barrett's esophagus without dysplasia: Secondary | ICD-10-CM | POA: Diagnosis not present

## 2020-05-30 DIAGNOSIS — N179 Acute kidney failure, unspecified: Secondary | ICD-10-CM | POA: Diagnosis not present

## 2020-05-30 DIAGNOSIS — I351 Nonrheumatic aortic (valve) insufficiency: Secondary | ICD-10-CM | POA: Diagnosis not present

## 2020-05-30 DIAGNOSIS — N1832 Chronic kidney disease, stage 3b: Secondary | ICD-10-CM | POA: Diagnosis not present

## 2020-05-30 DIAGNOSIS — I501 Left ventricular failure: Secondary | ICD-10-CM | POA: Diagnosis not present

## 2020-06-04 DIAGNOSIS — J9601 Acute respiratory failure with hypoxia: Secondary | ICD-10-CM | POA: Diagnosis not present

## 2020-06-04 DIAGNOSIS — I351 Nonrheumatic aortic (valve) insufficiency: Secondary | ICD-10-CM | POA: Diagnosis not present

## 2020-06-04 DIAGNOSIS — N1832 Chronic kidney disease, stage 3b: Secondary | ICD-10-CM | POA: Diagnosis not present

## 2020-06-04 DIAGNOSIS — I5033 Acute on chronic diastolic (congestive) heart failure: Secondary | ICD-10-CM | POA: Diagnosis not present

## 2020-06-04 DIAGNOSIS — I501 Left ventricular failure: Secondary | ICD-10-CM | POA: Diagnosis not present

## 2020-06-04 DIAGNOSIS — I130 Hypertensive heart and chronic kidney disease with heart failure and stage 1 through stage 4 chronic kidney disease, or unspecified chronic kidney disease: Secondary | ICD-10-CM | POA: Diagnosis not present

## 2020-06-04 DIAGNOSIS — K2270 Barrett's esophagus without dysplasia: Secondary | ICD-10-CM | POA: Diagnosis not present

## 2020-06-04 DIAGNOSIS — N179 Acute kidney failure, unspecified: Secondary | ICD-10-CM | POA: Diagnosis not present

## 2020-06-04 DIAGNOSIS — G4733 Obstructive sleep apnea (adult) (pediatric): Secondary | ICD-10-CM | POA: Diagnosis not present

## 2020-06-05 ENCOUNTER — Telehealth: Payer: Self-pay | Admitting: Pulmonary Disease

## 2020-06-05 DIAGNOSIS — N189 Chronic kidney disease, unspecified: Secondary | ICD-10-CM | POA: Diagnosis not present

## 2020-06-05 DIAGNOSIS — I129 Hypertensive chronic kidney disease with stage 1 through stage 4 chronic kidney disease, or unspecified chronic kidney disease: Secondary | ICD-10-CM | POA: Diagnosis not present

## 2020-06-05 DIAGNOSIS — D631 Anemia in chronic kidney disease: Secondary | ICD-10-CM | POA: Diagnosis not present

## 2020-06-05 DIAGNOSIS — I5032 Chronic diastolic (congestive) heart failure: Secondary | ICD-10-CM | POA: Diagnosis not present

## 2020-06-05 DIAGNOSIS — J9610 Chronic respiratory failure, unspecified whether with hypoxia or hypercapnia: Secondary | ICD-10-CM | POA: Diagnosis not present

## 2020-06-05 DIAGNOSIS — G4730 Sleep apnea, unspecified: Secondary | ICD-10-CM | POA: Diagnosis not present

## 2020-06-05 DIAGNOSIS — N1832 Chronic kidney disease, stage 3b: Secondary | ICD-10-CM | POA: Diagnosis not present

## 2020-06-05 DIAGNOSIS — G4733 Obstructive sleep apnea (adult) (pediatric): Secondary | ICD-10-CM

## 2020-06-05 NOTE — Telephone Encounter (Signed)
Called and spoke to [place] with [employer]. Pt was d/c from hospital with trilogy non-invasive vent. Per pt's d/c note pt is to continue on the vent until she can get her cpap. Per Melissa, the order for the CPAP is already placed and pt has already had her sleep study and qualifies. Per Melissa only a d/c order for trilogy is needed. She states pt will not be without therapy, they will remove the trilogy when pt receives her cpap. Order placed to d/c trilogy.   Will forward to Dr. Wynona Neat as [place]ain Childes.

## 2020-06-11 DIAGNOSIS — J9600 Acute respiratory failure, unspecified whether with hypoxia or hypercapnia: Secondary | ICD-10-CM | POA: Diagnosis not present

## 2020-06-11 DIAGNOSIS — K2270 Barrett's esophagus without dysplasia: Secondary | ICD-10-CM | POA: Diagnosis not present

## 2020-06-19 ENCOUNTER — Ambulatory Visit: Payer: Medicare PPO | Admitting: Pulmonary Disease

## 2020-06-19 DIAGNOSIS — E662 Morbid (severe) obesity with alveolar hypoventilation: Secondary | ICD-10-CM | POA: Diagnosis not present

## 2020-06-19 DIAGNOSIS — J9610 Chronic respiratory failure, unspecified whether with hypoxia or hypercapnia: Secondary | ICD-10-CM | POA: Diagnosis not present

## 2020-06-24 ENCOUNTER — Ambulatory Visit: Payer: Medicare PPO | Admitting: Pulmonary Disease

## 2020-06-24 ENCOUNTER — Other Ambulatory Visit: Payer: Self-pay

## 2020-06-24 ENCOUNTER — Encounter: Payer: Self-pay | Admitting: Pulmonary Disease

## 2020-06-24 VITALS — BP 126/78 | HR 63 | Temp 97.7°F | Ht 66.0 in | Wt 235.0 lb

## 2020-06-24 DIAGNOSIS — G4733 Obstructive sleep apnea (adult) (pediatric): Secondary | ICD-10-CM

## 2020-06-24 NOTE — Patient Instructions (Signed)
Will contact medical supply company to reduce pressure to 8  Give me a call in a couple of days if you are not tolerating the 8, if you feel you are not getting enough air and that can be changed  Give [place] a call in about 2 weeks to let us know how you are tolerating the pressure  I will see you back in about 3 months  Call with any other significant concerns

## 2020-06-24 NOTE — Progress Notes (Signed)
Amanda Bauer    [id number]    [birth year]  Primary Care Physician:Prochnau, Rayfield Citizen, MD  Referring Physician: Philemon Kingdom, MD [address] [place],  [place] [address]  Chief complaint:   Patient with a history of gasping respiration, wakes up tired Had a sleep study done recently showing moderate obstructive sleep apnea, in for follow-up  HPI:  Patient currently on a trilogy ventilator at home Had sleep study performed 04/25/2020 showing moderate obstructive sleep apnea Received a CPAP on 06/23/2020, pressure setting of 13 She could not tolerate the pressure  We did discuss decreasing pressure She would like to try a much lower pressure of about 8 And we will keep an eye on symptoms  Has oxygen supplementation piped into the system at 2 L Has oxygen to use with ambulation during the day  Sleep habits remain about the same  Usually goes to bed between 10 and 11 Might take up to 2 hours to fall asleep sometimes About 3 awakenings Final wake up time between 10 AM and 11 AM  Has a son who has obstructive sleep apnea  About 20-30 pound weight gain recently -Decreased activity levels  Admits to occasional dryness of the mouth in the morning No morning headaches No night sweats Memory is good Has no difficulty driving for long hours   Outpatient Encounter Medications as of 06/24/2020  Medication Sig  . amLODipine (NORVASC) 5 MG tablet Take 1 tablet (5 mg total) by mouth daily.  Marland Kitchen atorvastatin (LIPITOR) 10 MG tablet Take 10 mg by mouth daily.  . furosemide (LASIX) 40 MG tablet Take 1 tablet (40 mg total) by mouth daily.  Marland Kitchen levothyroxine (SYNTHROID) 112 MCG tablet Take 112 mcg by mouth daily before breakfast.  . loratadine (CLARITIN) 10 MG tablet Take 10 mg by mouth daily as needed for allergies.  . valsartan (DIOVAN) 80 MG tablet Take 80 mg by mouth 2 (two) times daily.  . carvedilol (COREG) 12.5 MG tablet Take 1 tablet (12.5 mg total) by mouth 2 (two) times  daily.   No facility-administered encounter medications on file as of 06/24/2020.    Allergies as of 06/24/2020 - Review Complete 06/24/2020  Allergen Reaction Noted  . Aspirin Other (See Comments) 02/20/2020  . Contrast media [iodinated diagnostic agents]  06/24/2020  . Pravastatin Nausea Only 02/20/2020  . Tramadol Other (See Comments) 02/20/2020    Past Medical History:  Diagnosis Date  . Anemia   . Barrett esophagus   . Chronic kidney disease    Stage 3  . Colon polyps   . Diverticula of colon    Left colon  . Hemorrhoids    internal   . Hiatal hernia   . Hyperlipidemia   . Hypertension   . Hypothyroidism 02/20/2020  . Osteoarthritis 02/20/2020  . Sleep apnea    unconfirmed, undergoing workup    Past Surgical History:  Procedure Laterality Date  . ABDOMINAL HYSTERECTOMY    . complete hysterectomy      Family History  Problem Relation Age of Onset  . Hypertension Mother   . Stomach cancer Mother   . Stroke Father   . Cerebral aneurysm Other   . Cancer Other        GI Tract    Social History   Socioeconomic History  . Marital status: Married    Spouse name: Daesia Zylka  . Number of children: 3  . Years of education: Not on file  . Highest  education level: Not on file  Occupational History  . Not on file  Tobacco Use  . Smoking status: Former Smoker    Types: Cigarettes  . Smokeless tobacco: Former Neurosurgeon    Types: Snuff    Quit date: 05/17/1989  . Tobacco comment: Family previously denied smoking, patient states quit many years ago  Vaping Use  . Vaping Use: Never used  Substance and Sexual Activity  . Alcohol use: Yes    Comment: occasional  . Drug use: Never  . Sexual activity: Not on file  Other Topics Concern  . Not on file  Social History Narrative  . Not on file   Social Determinants of Health   Financial Resource Strain: Not on file  Food Insecurity: Not on file  Transportation Needs: Not on file  Physical Activity: Not on file   Stress: Not on file  Social Connections: Not on file  Intimate Partner Violence: Not on file    Review of Systems  Constitutional: Negative.  Negative for fatigue.  Respiratory: Positive for apnea. Negative for shortness of breath.   Psychiatric/Behavioral: Positive for sleep disturbance.    Vitals:   06/24/20 0915  BP: 126/78  Pulse: 63  Temp: 97.7 F (36.5 C)  SpO2: 98%     Physical Exam Constitutional:      Appearance: She is obese.  HENT:     Head: Normocephalic and atraumatic.  Eyes:     General:        Left eye: No discharge.  Cardiovascular:     Rate and Rhythm: Normal rate and regular rhythm.     Pulses: Normal pulses.     Heart sounds: Normal heart sounds. No murmur heard. No friction rub.  Pulmonary:     Effort: Pulmonary effort is normal. No respiratory distress.     Breath sounds: No stridor. No wheezing or rhonchi.  Musculoskeletal:     Cervical back: No rigidity or tenderness.  Neurological:     Mental Status: She is alert.  Psychiatric:        Mood and Affect: Mood normal.    Results of the Epworth flowsheet 02/26/2020  Sitting and reading 0  Watching TV 1  Sitting, inactive in a public place (e.g. a theatre or a meeting) 0  As a passenger in a car for an hour without a break 0  Lying down to rest in the afternoon when circumstances permit 0  Sitting and talking to someone 0  Sitting quietly after a lunch without alcohol 1  In a car, while stopped for a few minutes in traffic 0  Total score 2    Sleep study Reviewed and shows moderate obstructive sleep apnea Titrated to a pressure of 13 with good resolution of events  Assessment:  Moderate obstructive sleep apnea  Obesity  Nonrestorative sleep  Titration process was discussed with patient, we will change pressure and monitor symptoms  Possibility of using an auto titrating pressure setting also discussed  Plan/Recommendations: Decrease pressure to 8  Patient encouraged to  give [place] a call with respect to symptomatology  She should still have oxygen supplementation piped into system  Weight loss efforts encouraged  I will see her back in about 3 months  Encouraged to call with any significant concerns  Virl Diamond MD [facility] 06/24/2020, 9:33 AM  CC: Philemon Kingdom, MD

## 2020-07-02 DIAGNOSIS — E039 Hypothyroidism, unspecified: Secondary | ICD-10-CM | POA: Diagnosis not present

## 2020-07-02 DIAGNOSIS — I10 Essential (primary) hypertension: Secondary | ICD-10-CM | POA: Diagnosis not present

## 2020-07-02 DIAGNOSIS — E785 Hyperlipidemia, unspecified: Secondary | ICD-10-CM | POA: Diagnosis not present

## 2020-07-02 DIAGNOSIS — J9612 Chronic respiratory failure with hypercapnia: Secondary | ICD-10-CM | POA: Diagnosis not present

## 2020-07-12 DIAGNOSIS — K2270 Barrett's esophagus without dysplasia: Secondary | ICD-10-CM | POA: Diagnosis not present

## 2020-07-12 DIAGNOSIS — J9600 Acute respiratory failure, unspecified whether with hypoxia or hypercapnia: Secondary | ICD-10-CM | POA: Diagnosis not present

## 2020-07-17 DIAGNOSIS — E662 Morbid (severe) obesity with alveolar hypoventilation: Secondary | ICD-10-CM | POA: Diagnosis not present

## 2020-07-17 DIAGNOSIS — J9610 Chronic respiratory failure, unspecified whether with hypoxia or hypercapnia: Secondary | ICD-10-CM | POA: Diagnosis not present

## 2020-08-09 DIAGNOSIS — K2270 Barrett's esophagus without dysplasia: Secondary | ICD-10-CM | POA: Diagnosis not present

## 2020-08-09 DIAGNOSIS — J9600 Acute respiratory failure, unspecified whether with hypoxia or hypercapnia: Secondary | ICD-10-CM | POA: Diagnosis not present

## 2020-08-17 DIAGNOSIS — J9610 Chronic respiratory failure, unspecified whether with hypoxia or hypercapnia: Secondary | ICD-10-CM | POA: Diagnosis not present

## 2020-08-17 DIAGNOSIS — E662 Morbid (severe) obesity with alveolar hypoventilation: Secondary | ICD-10-CM | POA: Diagnosis not present

## 2020-09-09 DIAGNOSIS — J9600 Acute respiratory failure, unspecified whether with hypoxia or hypercapnia: Secondary | ICD-10-CM | POA: Diagnosis not present

## 2020-09-09 DIAGNOSIS — K2270 Barrett's esophagus without dysplasia: Secondary | ICD-10-CM | POA: Diagnosis not present

## 2020-09-16 DIAGNOSIS — J9610 Chronic respiratory failure, unspecified whether with hypoxia or hypercapnia: Secondary | ICD-10-CM | POA: Diagnosis not present

## 2020-09-16 DIAGNOSIS — E662 Morbid (severe) obesity with alveolar hypoventilation: Secondary | ICD-10-CM | POA: Diagnosis not present

## 2020-10-09 DIAGNOSIS — K2270 Barrett's esophagus without dysplasia: Secondary | ICD-10-CM | POA: Diagnosis not present

## 2020-10-09 DIAGNOSIS — N1832 Chronic kidney disease, stage 3b: Secondary | ICD-10-CM | POA: Diagnosis not present

## 2020-10-09 DIAGNOSIS — I129 Hypertensive chronic kidney disease with stage 1 through stage 4 chronic kidney disease, or unspecified chronic kidney disease: Secondary | ICD-10-CM | POA: Diagnosis not present

## 2020-10-09 DIAGNOSIS — I5032 Chronic diastolic (congestive) heart failure: Secondary | ICD-10-CM | POA: Diagnosis not present

## 2020-10-09 DIAGNOSIS — G4730 Sleep apnea, unspecified: Secondary | ICD-10-CM | POA: Diagnosis not present

## 2020-10-09 DIAGNOSIS — J9600 Acute respiratory failure, unspecified whether with hypoxia or hypercapnia: Secondary | ICD-10-CM | POA: Diagnosis not present

## 2020-10-17 DIAGNOSIS — J9610 Chronic respiratory failure, unspecified whether with hypoxia or hypercapnia: Secondary | ICD-10-CM | POA: Diagnosis not present

## 2020-10-17 DIAGNOSIS — E662 Morbid (severe) obesity with alveolar hypoventilation: Secondary | ICD-10-CM | POA: Diagnosis not present

## 2020-10-21 DIAGNOSIS — I10 Essential (primary) hypertension: Secondary | ICD-10-CM | POA: Diagnosis not present

## 2020-10-21 DIAGNOSIS — E785 Hyperlipidemia, unspecified: Secondary | ICD-10-CM | POA: Diagnosis not present

## 2020-10-21 DIAGNOSIS — E1169 Type 2 diabetes mellitus with other specified complication: Secondary | ICD-10-CM | POA: Diagnosis not present

## 2020-10-21 DIAGNOSIS — E039 Hypothyroidism, unspecified: Secondary | ICD-10-CM | POA: Diagnosis not present

## 2020-10-21 DIAGNOSIS — Z79899 Other long term (current) drug therapy: Secondary | ICD-10-CM | POA: Diagnosis not present

## 2020-10-21 DIAGNOSIS — N1832 Chronic kidney disease, stage 3b: Secondary | ICD-10-CM | POA: Diagnosis not present

## 2020-10-21 DIAGNOSIS — Z1339 Encounter for screening examination for other mental health and behavioral disorders: Secondary | ICD-10-CM | POA: Diagnosis not present

## 2020-10-21 DIAGNOSIS — Z0000 Encounter for general adult medical examination without abnormal findings: Secondary | ICD-10-CM | POA: Diagnosis not present

## 2020-10-21 DIAGNOSIS — J9612 Chronic respiratory failure with hypercapnia: Secondary | ICD-10-CM | POA: Diagnosis not present

## 2020-10-21 DIAGNOSIS — I5032 Chronic diastolic (congestive) heart failure: Secondary | ICD-10-CM | POA: Diagnosis not present

## 2020-10-21 DIAGNOSIS — Z1331 Encounter for screening for depression: Secondary | ICD-10-CM | POA: Diagnosis not present

## 2020-11-09 DIAGNOSIS — J9600 Acute respiratory failure, unspecified whether with hypoxia or hypercapnia: Secondary | ICD-10-CM | POA: Diagnosis not present

## 2020-11-09 DIAGNOSIS — K2270 Barrett's esophagus without dysplasia: Secondary | ICD-10-CM | POA: Diagnosis not present

## 2020-11-16 DIAGNOSIS — J9610 Chronic respiratory failure, unspecified whether with hypoxia or hypercapnia: Secondary | ICD-10-CM | POA: Diagnosis not present

## 2020-11-16 DIAGNOSIS — E662 Morbid (severe) obesity with alveolar hypoventilation: Secondary | ICD-10-CM | POA: Diagnosis not present

## 2020-11-20 DIAGNOSIS — R062 Wheezing: Secondary | ICD-10-CM | POA: Diagnosis not present

## 2020-11-20 DIAGNOSIS — R7303 Prediabetes: Secondary | ICD-10-CM | POA: Diagnosis not present

## 2020-12-09 DIAGNOSIS — K2270 Barrett's esophagus without dysplasia: Secondary | ICD-10-CM | POA: Diagnosis not present

## 2020-12-09 DIAGNOSIS — J9600 Acute respiratory failure, unspecified whether with hypoxia or hypercapnia: Secondary | ICD-10-CM | POA: Diagnosis not present

## 2020-12-17 DIAGNOSIS — J9610 Chronic respiratory failure, unspecified whether with hypoxia or hypercapnia: Secondary | ICD-10-CM | POA: Diagnosis not present

## 2020-12-17 DIAGNOSIS — E662 Morbid (severe) obesity with alveolar hypoventilation: Secondary | ICD-10-CM | POA: Diagnosis not present

## 2020-12-19 ENCOUNTER — Encounter: Payer: Self-pay | Admitting: Pulmonary Disease

## 2020-12-19 ENCOUNTER — Other Ambulatory Visit: Payer: Self-pay

## 2020-12-19 ENCOUNTER — Ambulatory Visit: Payer: Medicare PPO | Admitting: Pulmonary Disease

## 2020-12-19 VITALS — BP 130/76 | HR 55 | Temp 98.0°F | Ht 66.0 in | Wt 237.6 lb

## 2020-12-19 DIAGNOSIS — G4733 Obstructive sleep apnea (adult) (pediatric): Secondary | ICD-10-CM | POA: Diagnosis not present

## 2020-12-19 NOTE — Progress Notes (Signed)
Amanda Bauer    [id number]    05-30-1945  Primary Care Physician:Prochnau, Rayfield Citizen, MD  Referring Physician: Philemon Kingdom, MD [address] [place],  [place] [address]  Chief complaint:   Patient with a history of gasping respiration, wakes up tired Had a sleep study done recently showing moderate obstructive sleep apnea, in for follow-up  HPI:  Patient currently on a trilogy ventilator at home Had sleep study performed 04/25/2020 showing moderate obstructive sleep apnea Received a CPAP on 06/23/2020, pressure setting of 13 She could not tolerate the pressure  Continues to use the trilogy ventilator Does not like it much because it makes a lot of noise but she is getting used to it  Has oxygen supplementation piped into the system at 2 L Has oxygen to use with ambulation during the day  Sleep quality has not really changed much Tries to stay active Sometimes does have feeling of wooziness  Usually goes to bed between 10 and 11 Might take up to 2 hours to fall asleep sometimes About 3 awakenings Final wake up time between 10 AM and 11 AM  Has a son who has obstructive sleep apnea  About 20-30 pound weight gain recently -Decreased activity levels  Admits to occasional dryness of the mouth in the morning No morning headaches No night sweats Memory is good Has no difficulty driving for long hours   Outpatient Encounter Medications as of 12/19/2020  Medication Sig   amLODipine (NORVASC) 5 MG tablet Take 1 tablet (5 mg total) by mouth daily.   atorvastatin (LIPITOR) 10 MG tablet Take 10 mg by mouth daily.   carvedilol (COREG) 12.5 MG tablet Take 1 tablet (12.5 mg total) by mouth 2 (two) times daily.   furosemide (LASIX) 40 MG tablet Take 1 tablet (40 mg total) by mouth daily.   levothyroxine (SYNTHROID) 112 MCG tablet Take 112 mcg by mouth daily before breakfast.   loratadine (CLARITIN) 10 MG tablet Take 10 mg by mouth daily as needed for allergies.    valsartan (DIOVAN) 80 MG tablet Take 80 mg by mouth 2 (two) times daily.   No facility-administered encounter medications on file as of 12/19/2020.    Allergies as of 12/19/2020 - Review Complete 12/19/2020  Allergen Reaction Noted   Aspirin Other (See Comments) 02/20/2020   Contrast media [iodinated diagnostic agents]  06/24/2020   Pravastatin Nausea Only 02/20/2020   Tramadol Other (See Comments) 02/20/2020    Past Medical History:  Diagnosis Date   Anemia    Barrett esophagus    Chronic kidney disease    Stage 3   Colon polyps    Diverticula of colon    Left colon   Hemorrhoids    internal    Hiatal hernia    Hyperlipidemia    Hypertension    Hypothyroidism 02/20/2020   Osteoarthritis 02/20/2020   Sleep apnea    unconfirmed, undergoing workup    Past Surgical History:  Procedure Laterality Date   ABDOMINAL HYSTERECTOMY     complete hysterectomy      Family History  Problem Relation Age of Onset   Hypertension Mother    Stomach cancer Mother    Stroke Father    Cerebral aneurysm Other    Cancer Other        GI Tract    Social History   Socioeconomic History   Marital status: Married    Spouse name: Amanda Bauer   Number of children: 3  Years of education: Not on file   Highest education level: Not on file  Occupational History   Not on file  Tobacco Use   Smoking status: Former    Types: Cigarettes   Smokeless tobacco: Former    Types: Snuff    Quit date: 05/17/1989   Tobacco comments:    Family previously denied smoking, patient states quit many years ago  Vaping Use   Vaping Use: Never used  Substance and Sexual Activity   Alcohol use: Yes    Comment: occasional   Drug use: Never   Sexual activity: Not on file  Other Topics Concern   Not on file  Social History Narrative   Not on file   Social Determinants of Health   Financial Resource Strain: Not on file  Food Insecurity: Not on file  Transportation Needs: Not on file  Physical  Activity: Not on file  Stress: Not on file  Social Connections: Not on file  Intimate Partner Violence: Not on file    Review of Systems  Constitutional: Negative.  Negative for fatigue.  Respiratory:  Positive for apnea. Negative for shortness of breath.   Psychiatric/Behavioral:  Positive for sleep disturbance.    Vitals:   12/19/20 1130  BP: 130/76  Pulse: (!) 55  Temp: 98 F (36.7 C)  SpO2: 97%     Physical Exam Constitutional:      Appearance: She is obese.  Eyes:     General:        Left eye: No discharge.  Cardiovascular:     Rate and Rhythm: Normal rate and regular rhythm.     Pulses: Normal pulses.     Heart sounds: Normal heart sounds. No murmur heard.   No friction rub.  Pulmonary:     Effort: Pulmonary effort is normal. No respiratory distress.     Breath sounds: No stridor. No wheezing or rhonchi.  Musculoskeletal:     Cervical back: No rigidity or tenderness.  Neurological:     Mental Status: She is alert.  Psychiatric:        Mood and Affect: Mood normal.   Results of the Epworth flowsheet 02/26/2020  Sitting and reading 0  Watching TV 1  Sitting, inactive in a public place (e.g. a theatre or a meeting) 0  As a passenger in a car for an hour without a break 0  Lying down to rest in the afternoon when circumstances permit 0  Sitting and talking to someone 0  Sitting quietly after a lunch without alcohol 1  In a car, while stopped for a few minutes in traffic 0  Total score 2    Sleep study Reviewed and shows moderate obstructive sleep apnea Titrated to a pressure of 13 with good resolution of events  PSSV compliance results from trilogy ventilator shows average use of 9 hours 25 minutes No significant leak No significant events noted  Assessment:  Moderate obstructive sleep apnea  Chronic respiratory failure  Obesity  Nonrestorative sleep   Plan/Recommendations: Decrease pressure to 8  Continue trilogy ventilator use  Weight  loss efforts encouraged  I will see her back in about 6 months  Encouraged to call with any significant concerns  Virl Diamond MD [facility] 12/19/2020, 11:42 AM  CC: Amanda Kingdom, MD

## 2020-12-19 NOTE — Patient Instructions (Signed)
Continue trilogy use at night-sleep macxhine  Regular graded exercise as tolerated  I will see you back in 6 months  Call with significant concerns

## 2021-01-01 DIAGNOSIS — E1169 Type 2 diabetes mellitus with other specified complication: Secondary | ICD-10-CM | POA: Diagnosis not present

## 2021-01-01 DIAGNOSIS — I10 Essential (primary) hypertension: Secondary | ICD-10-CM | POA: Diagnosis not present

## 2021-01-01 DIAGNOSIS — E039 Hypothyroidism, unspecified: Secondary | ICD-10-CM | POA: Diagnosis not present

## 2021-01-01 DIAGNOSIS — E785 Hyperlipidemia, unspecified: Secondary | ICD-10-CM | POA: Diagnosis not present

## 2021-01-08 DIAGNOSIS — R300 Dysuria: Secondary | ICD-10-CM | POA: Diagnosis not present

## 2021-01-09 DIAGNOSIS — J9600 Acute respiratory failure, unspecified whether with hypoxia or hypercapnia: Secondary | ICD-10-CM | POA: Diagnosis not present

## 2021-01-09 DIAGNOSIS — K2270 Barrett's esophagus without dysplasia: Secondary | ICD-10-CM | POA: Diagnosis not present

## 2021-01-17 DIAGNOSIS — E662 Morbid (severe) obesity with alveolar hypoventilation: Secondary | ICD-10-CM | POA: Diagnosis not present

## 2021-01-17 DIAGNOSIS — J9610 Chronic respiratory failure, unspecified whether with hypoxia or hypercapnia: Secondary | ICD-10-CM | POA: Diagnosis not present

## 2021-01-21 DIAGNOSIS — N6312 Unspecified lump in the right breast, upper inner quadrant: Secondary | ICD-10-CM | POA: Diagnosis not present

## 2021-01-21 DIAGNOSIS — N6001 Solitary cyst of right breast: Secondary | ICD-10-CM | POA: Diagnosis not present

## 2021-01-21 DIAGNOSIS — R928 Other abnormal and inconclusive findings on diagnostic imaging of breast: Secondary | ICD-10-CM | POA: Diagnosis not present

## 2021-02-05 DIAGNOSIS — I739 Peripheral vascular disease, unspecified: Secondary | ICD-10-CM | POA: Diagnosis not present

## 2021-02-05 DIAGNOSIS — G4733 Obstructive sleep apnea (adult) (pediatric): Secondary | ICD-10-CM | POA: Diagnosis not present

## 2021-02-05 DIAGNOSIS — I509 Heart failure, unspecified: Secondary | ICD-10-CM | POA: Diagnosis not present

## 2021-02-05 DIAGNOSIS — E785 Hyperlipidemia, unspecified: Secondary | ICD-10-CM | POA: Diagnosis not present

## 2021-02-05 DIAGNOSIS — J9610 Chronic respiratory failure, unspecified whether with hypoxia or hypercapnia: Secondary | ICD-10-CM | POA: Diagnosis not present

## 2021-02-05 DIAGNOSIS — E039 Hypothyroidism, unspecified: Secondary | ICD-10-CM | POA: Diagnosis not present

## 2021-02-05 DIAGNOSIS — I130 Hypertensive heart and chronic kidney disease with heart failure and stage 1 through stage 4 chronic kidney disease, or unspecified chronic kidney disease: Secondary | ICD-10-CM | POA: Diagnosis not present

## 2021-02-05 DIAGNOSIS — J309 Allergic rhinitis, unspecified: Secondary | ICD-10-CM | POA: Diagnosis not present

## 2021-02-09 DIAGNOSIS — K2270 Barrett's esophagus without dysplasia: Secondary | ICD-10-CM | POA: Diagnosis not present

## 2021-02-09 DIAGNOSIS — J9600 Acute respiratory failure, unspecified whether with hypoxia or hypercapnia: Secondary | ICD-10-CM | POA: Diagnosis not present

## 2021-02-16 DIAGNOSIS — J9610 Chronic respiratory failure, unspecified whether with hypoxia or hypercapnia: Secondary | ICD-10-CM | POA: Diagnosis not present

## 2021-02-16 DIAGNOSIS — E662 Morbid (severe) obesity with alveolar hypoventilation: Secondary | ICD-10-CM | POA: Diagnosis not present

## 2021-03-11 DIAGNOSIS — J9600 Acute respiratory failure, unspecified whether with hypoxia or hypercapnia: Secondary | ICD-10-CM | POA: Diagnosis not present

## 2021-03-11 DIAGNOSIS — K2270 Barrett's esophagus without dysplasia: Secondary | ICD-10-CM | POA: Diagnosis not present

## 2021-03-13 ENCOUNTER — Other Ambulatory Visit: Payer: Self-pay | Admitting: Cardiology

## 2021-03-19 DIAGNOSIS — J9610 Chronic respiratory failure, unspecified whether with hypoxia or hypercapnia: Secondary | ICD-10-CM | POA: Diagnosis not present

## 2021-03-19 DIAGNOSIS — E662 Morbid (severe) obesity with alveolar hypoventilation: Secondary | ICD-10-CM | POA: Diagnosis not present

## 2021-04-06 DIAGNOSIS — I129 Hypertensive chronic kidney disease with stage 1 through stage 4 chronic kidney disease, or unspecified chronic kidney disease: Secondary | ICD-10-CM | POA: Diagnosis not present

## 2021-04-06 DIAGNOSIS — G4730 Sleep apnea, unspecified: Secondary | ICD-10-CM | POA: Diagnosis not present

## 2021-04-06 DIAGNOSIS — N2581 Secondary hyperparathyroidism of renal origin: Secondary | ICD-10-CM | POA: Diagnosis not present

## 2021-04-06 DIAGNOSIS — D631 Anemia in chronic kidney disease: Secondary | ICD-10-CM | POA: Diagnosis not present

## 2021-04-06 DIAGNOSIS — N1832 Chronic kidney disease, stage 3b: Secondary | ICD-10-CM | POA: Diagnosis not present

## 2021-04-06 DIAGNOSIS — N189 Chronic kidney disease, unspecified: Secondary | ICD-10-CM | POA: Diagnosis not present

## 2021-04-06 DIAGNOSIS — I5032 Chronic diastolic (congestive) heart failure: Secondary | ICD-10-CM | POA: Diagnosis not present

## 2021-04-11 DIAGNOSIS — J9600 Acute respiratory failure, unspecified whether with hypoxia or hypercapnia: Secondary | ICD-10-CM | POA: Diagnosis not present

## 2021-04-11 DIAGNOSIS — K2270 Barrett's esophagus without dysplasia: Secondary | ICD-10-CM | POA: Diagnosis not present

## 2021-05-11 DIAGNOSIS — J9600 Acute respiratory failure, unspecified whether with hypoxia or hypercapnia: Secondary | ICD-10-CM | POA: Diagnosis not present

## 2021-05-11 DIAGNOSIS — K2270 Barrett's esophagus without dysplasia: Secondary | ICD-10-CM | POA: Diagnosis not present

## 2021-06-08 ENCOUNTER — Other Ambulatory Visit: Payer: Self-pay | Admitting: Cardiology

## 2021-06-11 DIAGNOSIS — J9600 Acute respiratory failure, unspecified whether with hypoxia or hypercapnia: Secondary | ICD-10-CM | POA: Diagnosis not present

## 2021-06-11 DIAGNOSIS — K2270 Barrett's esophagus without dysplasia: Secondary | ICD-10-CM | POA: Diagnosis not present

## 2021-06-19 ENCOUNTER — Encounter: Payer: Self-pay | Admitting: Pulmonary Disease

## 2021-06-19 ENCOUNTER — Other Ambulatory Visit: Payer: Self-pay

## 2021-06-19 ENCOUNTER — Ambulatory Visit: Payer: Medicare PPO | Admitting: Pulmonary Disease

## 2021-06-19 VITALS — BP 128/76 | HR 60 | Temp 97.5°F | Ht 66.0 in | Wt 230.0 lb

## 2021-06-19 DIAGNOSIS — R0602 Shortness of breath: Secondary | ICD-10-CM | POA: Diagnosis not present

## 2021-06-19 DIAGNOSIS — G4733 Obstructive sleep apnea (adult) (pediatric): Secondary | ICD-10-CM | POA: Diagnosis not present

## 2021-06-19 NOTE — Progress Notes (Signed)
Amanda Bauer    [id number]    1945-06-05  Primary Care Physician:Prochnau, Chrys Racer, MD  Referring Physician: Ernestene Kiel, MD [address] [place],  [place] [address]  Chief complaint:    Follow-up for sleep apnea Chronic respiratory failure  HPI:  She has a trilogy ventilator that she uses nightly Sleep study in 2021 revealed moderate obstructive sleep apnea Could not tolerate CPAP in the past with a pressure of 13, currently on a trilogy ventilator and she is tolerating this well  She still does not like the ventilator but she is continuing to use it on a nightly basis -2 L of oxygen with trilogy at night  Sleep quality remains about the same She tries to remain active Does try to get enough sleep at night  Usually goes to bed between 10 and 11 Might take up to 2 hours to fall asleep sometimes About 3 awakenings Final wake up time between 10 AM and 11 AM  Has a son who has obstructive sleep apnea  About 20-30 pound weight gain recently -Decreased activity levels  Admits to occasional dryness of the mouth in the morning No morning headaches No night sweats Memory is good Has no difficulty driving for long hours   Outpatient Encounter Medications as of 06/19/2021  Medication Sig   amLODipine (NORVASC) 5 MG tablet Take 1 tablet (5 mg total) by mouth daily.   atorvastatin (LIPITOR) 10 MG tablet Take 10 mg by mouth daily.   carvedilol (COREG) 12.5 MG tablet Take 1 tablet by mouth twice daily   furosemide (LASIX) 40 MG tablet Take 1 tablet (40 mg total) by mouth daily.   levothyroxine (SYNTHROID) 112 MCG tablet Take 112 mcg by mouth daily before breakfast.   loratadine (CLARITIN) 10 MG tablet Take 10 mg by mouth daily as needed for allergies.   valsartan (DIOVAN) 80 MG tablet Take 80 mg by mouth 2 (two) times daily.   No facility-administered encounter medications on file as of 06/19/2021.    Allergies as of 06/19/2021 - Review Complete 12/19/2020   Allergen Reaction Noted   Aspirin Other (See Comments) 02/20/2020   Contrast media [iodinated contrast media]  06/24/2020   Pravastatin Nausea Only 02/20/2020   Tramadol Other (See Comments) 02/20/2020    Past Medical History:  Diagnosis Date   Anemia    Barrett esophagus    Chronic kidney disease    Stage 3   Colon polyps    Diverticula of colon    Left colon   Hemorrhoids    internal    Hiatal hernia    Hyperlipidemia    Hypertension    Hypothyroidism 02/20/2020   Osteoarthritis 02/20/2020   Sleep apnea    unconfirmed, undergoing workup    Past Surgical History:  Procedure Laterality Date   ABDOMINAL HYSTERECTOMY     complete hysterectomy      Family History  Problem Relation Age of Onset   Hypertension Mother    Stomach cancer Mother    Stroke Father    Cerebral aneurysm Other    Cancer Other        GI Tract    Social History   Socioeconomic History   Marital status: Married    Spouse name: Snezana Presby   Number of children: 3   Years of education: Not on file   Highest education level: Not on file  Occupational History   Not on file  Tobacco Use   Smoking  status: Former    Types: Cigarettes   Smokeless tobacco: Former    Types: Snuff    Quit date: 05/17/1989   Tobacco comments:    Family previously denied smoking, patient states quit many years ago  Vaping Use   Vaping Use: Never used  Substance and Sexual Activity   Alcohol use: Yes    Comment: occasional   Drug use: Never   Sexual activity: Not on file  Other Topics Concern   Not on file  Social History Narrative   Not on file   Social Determinants of Health   Financial Resource Strain: Not on file  Food Insecurity: Not on file  Transportation Needs: Not on file  Physical Activity: Not on file  Stress: Not on file  Social Connections: Not on file  Intimate Partner Violence: Not on file    Review of Systems  Constitutional: Negative.  Negative for fatigue.  Respiratory:  Positive  for apnea. Negative for shortness of breath.   Psychiatric/Behavioral:  Positive for sleep disturbance.    There were no vitals filed for this visit.    Physical Exam Constitutional:      Appearance: She is obese.  HENT:     Nose: Nose normal.  Eyes:     General:        Left eye: No discharge.     Pupils: Pupils are equal, round, and reactive to light.  Cardiovascular:     Rate and Rhythm: Normal rate and regular rhythm.     Pulses: Normal pulses.     Heart sounds: Normal heart sounds. No murmur heard.   No friction rub.  Pulmonary:     Effort: Pulmonary effort is normal. No respiratory distress.     Breath sounds: No stridor. No wheezing or rhonchi.  Musculoskeletal:     Cervical back: No rigidity or tenderness.  Neurological:     Mental Status: She is alert.  Psychiatric:        Mood and Affect: Mood normal.   Results of the Epworth flowsheet 02/26/2020  Sitting and reading 0  Watching TV 1  Sitting, inactive in a public place (e.g. a theatre or a meeting) 0  As a passenger in a car for an hour without a break 0  Lying down to rest in the afternoon when circumstances permit 0  Sitting and talking to someone 0  Sitting quietly after a lunch without alcohol 1  In a car, while stopped for a few minutes in traffic 0  Total score 2    Sleep study Reviewed and shows moderate obstructive sleep apnea Titrated to a pressure of 13 with good resolution of events  Compliance data reveals 97% compliance Average use of 9 hours 18 minutes Residual AHI of 0.1   Assessment:  Moderate obstructive sleep apnea Chronic respiratory failure -On trilogy ventilator at night -Tolerating it well -Getting good rest with use of the trilogy ventilator  Obesity -She is trying to stay active -Her weight remains about the same  Nonrestorative sleep -Does not have significant daytime symptoms   Plan/Recommendations:  Continue trilogy ventilator use -Encouraged to continue to use  it nightly  Weight loss efforts encouraged Regular exercises encouraged  I will see her back in about 6 months  Encouraged to call with any significant concerns  We will obtain a pulmonary function test on the day of next visit  Virl Diamond MD [facility] 06/19/2021, 8:34 AM  CC: Philemon Kingdom, MD

## 2021-06-19 NOTE — Patient Instructions (Signed)
I will see you back in 6 months  Regular exercises as tolerated  Continue using your trilogy ventilator at night  Schedule for breathing study on the day of next visit  Call with any significant concerns

## 2021-07-08 DIAGNOSIS — E1121 Type 2 diabetes mellitus with diabetic nephropathy: Secondary | ICD-10-CM | POA: Diagnosis not present

## 2021-07-08 DIAGNOSIS — N1832 Chronic kidney disease, stage 3b: Secondary | ICD-10-CM | POA: Diagnosis not present

## 2021-07-08 DIAGNOSIS — E1122 Type 2 diabetes mellitus with diabetic chronic kidney disease: Secondary | ICD-10-CM | POA: Diagnosis not present

## 2021-07-08 DIAGNOSIS — E785 Hyperlipidemia, unspecified: Secondary | ICD-10-CM | POA: Diagnosis not present

## 2021-07-08 DIAGNOSIS — E1169 Type 2 diabetes mellitus with other specified complication: Secondary | ICD-10-CM | POA: Diagnosis not present

## 2021-07-08 DIAGNOSIS — Z6836 Body mass index (BMI) 36.0-36.9, adult: Secondary | ICD-10-CM | POA: Diagnosis not present

## 2021-07-08 DIAGNOSIS — I10 Essential (primary) hypertension: Secondary | ICD-10-CM | POA: Diagnosis not present

## 2021-07-08 DIAGNOSIS — E039 Hypothyroidism, unspecified: Secondary | ICD-10-CM | POA: Diagnosis not present

## 2021-07-08 DIAGNOSIS — J9612 Chronic respiratory failure with hypercapnia: Secondary | ICD-10-CM | POA: Diagnosis not present

## 2021-07-08 DIAGNOSIS — Z79899 Other long term (current) drug therapy: Secondary | ICD-10-CM | POA: Diagnosis not present

## 2021-07-12 DIAGNOSIS — K2270 Barrett's esophagus without dysplasia: Secondary | ICD-10-CM | POA: Diagnosis not present

## 2021-07-12 DIAGNOSIS — J9600 Acute respiratory failure, unspecified whether with hypoxia or hypercapnia: Secondary | ICD-10-CM | POA: Diagnosis not present

## 2021-07-29 DIAGNOSIS — E669 Obesity, unspecified: Secondary | ICD-10-CM | POA: Diagnosis not present

## 2021-07-29 DIAGNOSIS — E039 Hypothyroidism, unspecified: Secondary | ICD-10-CM | POA: Diagnosis not present

## 2021-07-29 DIAGNOSIS — E1151 Type 2 diabetes mellitus with diabetic peripheral angiopathy without gangrene: Secondary | ICD-10-CM | POA: Diagnosis not present

## 2021-07-29 DIAGNOSIS — J309 Allergic rhinitis, unspecified: Secondary | ICD-10-CM | POA: Diagnosis not present

## 2021-07-29 DIAGNOSIS — I509 Heart failure, unspecified: Secondary | ICD-10-CM | POA: Diagnosis not present

## 2021-07-29 DIAGNOSIS — J9611 Chronic respiratory failure with hypoxia: Secondary | ICD-10-CM | POA: Diagnosis not present

## 2021-07-29 DIAGNOSIS — I110 Hypertensive heart disease with heart failure: Secondary | ICD-10-CM | POA: Diagnosis not present

## 2021-07-29 DIAGNOSIS — E261 Secondary hyperaldosteronism: Secondary | ICD-10-CM | POA: Diagnosis not present

## 2021-07-29 DIAGNOSIS — E785 Hyperlipidemia, unspecified: Secondary | ICD-10-CM | POA: Diagnosis not present

## 2021-08-09 DIAGNOSIS — K2270 Barrett's esophagus without dysplasia: Secondary | ICD-10-CM | POA: Diagnosis not present

## 2021-08-09 DIAGNOSIS — J9600 Acute respiratory failure, unspecified whether with hypoxia or hypercapnia: Secondary | ICD-10-CM | POA: Diagnosis not present

## 2021-09-09 DIAGNOSIS — J9600 Acute respiratory failure, unspecified whether with hypoxia or hypercapnia: Secondary | ICD-10-CM | POA: Diagnosis not present

## 2021-09-09 DIAGNOSIS — K2270 Barrett's esophagus without dysplasia: Secondary | ICD-10-CM | POA: Diagnosis not present

## 2021-09-13 ENCOUNTER — Other Ambulatory Visit: Payer: Self-pay | Admitting: Cardiology

## 2021-09-22 DIAGNOSIS — I10 Essential (primary) hypertension: Secondary | ICD-10-CM | POA: Diagnosis not present

## 2021-09-22 DIAGNOSIS — I739 Peripheral vascular disease, unspecified: Secondary | ICD-10-CM | POA: Diagnosis not present

## 2021-10-09 DIAGNOSIS — J9600 Acute respiratory failure, unspecified whether with hypoxia or hypercapnia: Secondary | ICD-10-CM | POA: Diagnosis not present

## 2021-10-09 DIAGNOSIS — K2270 Barrett's esophagus without dysplasia: Secondary | ICD-10-CM | POA: Diagnosis not present

## 2021-10-29 DIAGNOSIS — R7303 Prediabetes: Secondary | ICD-10-CM | POA: Diagnosis not present

## 2021-10-29 DIAGNOSIS — I5032 Chronic diastolic (congestive) heart failure: Secondary | ICD-10-CM | POA: Diagnosis not present

## 2021-10-29 DIAGNOSIS — E785 Hyperlipidemia, unspecified: Secondary | ICD-10-CM | POA: Diagnosis not present

## 2021-10-29 DIAGNOSIS — N1832 Chronic kidney disease, stage 3b: Secondary | ICD-10-CM | POA: Diagnosis not present

## 2021-10-29 DIAGNOSIS — G4730 Sleep apnea, unspecified: Secondary | ICD-10-CM | POA: Diagnosis not present

## 2021-10-29 DIAGNOSIS — I129 Hypertensive chronic kidney disease with stage 1 through stage 4 chronic kidney disease, or unspecified chronic kidney disease: Secondary | ICD-10-CM | POA: Diagnosis not present

## 2021-11-09 DIAGNOSIS — J9600 Acute respiratory failure, unspecified whether with hypoxia or hypercapnia: Secondary | ICD-10-CM | POA: Diagnosis not present

## 2021-11-09 DIAGNOSIS — K2270 Barrett's esophagus without dysplasia: Secondary | ICD-10-CM | POA: Diagnosis not present

## 2021-11-10 DIAGNOSIS — Z0000 Encounter for general adult medical examination without abnormal findings: Secondary | ICD-10-CM | POA: Diagnosis not present

## 2021-11-10 DIAGNOSIS — I10 Essential (primary) hypertension: Secondary | ICD-10-CM | POA: Diagnosis not present

## 2021-11-10 DIAGNOSIS — N1832 Chronic kidney disease, stage 3b: Secondary | ICD-10-CM | POA: Diagnosis not present

## 2021-11-10 DIAGNOSIS — E1121 Type 2 diabetes mellitus with diabetic nephropathy: Secondary | ICD-10-CM | POA: Diagnosis not present

## 2021-11-10 DIAGNOSIS — Z1331 Encounter for screening for depression: Secondary | ICD-10-CM | POA: Diagnosis not present

## 2021-11-10 DIAGNOSIS — E785 Hyperlipidemia, unspecified: Secondary | ICD-10-CM | POA: Diagnosis not present

## 2021-11-10 DIAGNOSIS — Z6836 Body mass index (BMI) 36.0-36.9, adult: Secondary | ICD-10-CM | POA: Diagnosis not present

## 2021-11-10 DIAGNOSIS — E039 Hypothyroidism, unspecified: Secondary | ICD-10-CM | POA: Diagnosis not present

## 2021-12-09 DIAGNOSIS — J9600 Acute respiratory failure, unspecified whether with hypoxia or hypercapnia: Secondary | ICD-10-CM | POA: Diagnosis not present

## 2021-12-09 DIAGNOSIS — K2270 Barrett's esophagus without dysplasia: Secondary | ICD-10-CM | POA: Diagnosis not present

## 2022-01-09 DIAGNOSIS — J9600 Acute respiratory failure, unspecified whether with hypoxia or hypercapnia: Secondary | ICD-10-CM | POA: Diagnosis not present

## 2022-01-09 DIAGNOSIS — K2270 Barrett's esophagus without dysplasia: Secondary | ICD-10-CM | POA: Diagnosis not present

## 2022-01-19 ENCOUNTER — Encounter: Payer: Self-pay | Admitting: Pulmonary Disease

## 2022-01-19 ENCOUNTER — Ambulatory Visit: Payer: Medicare PPO | Admitting: Pulmonary Disease

## 2022-01-19 ENCOUNTER — Ambulatory Visit (INDEPENDENT_AMBULATORY_CARE_PROVIDER_SITE_OTHER): Payer: Medicare PPO | Admitting: Pulmonary Disease

## 2022-01-19 VITALS — BP 120/78 | HR 51 | Temp 97.8°F | Ht 66.0 in | Wt 223.0 lb

## 2022-01-19 DIAGNOSIS — Z9989 Dependence on other enabling machines and devices: Secondary | ICD-10-CM | POA: Diagnosis not present

## 2022-01-19 DIAGNOSIS — G4733 Obstructive sleep apnea (adult) (pediatric): Secondary | ICD-10-CM

## 2022-01-19 DIAGNOSIS — R0602 Shortness of breath: Secondary | ICD-10-CM

## 2022-01-19 LAB — PULMONARY FUNCTION TEST
DL/VA % pred: 73 %
DL/VA: 2.97 ml/min/mmHg/L
DLCO cor % pred: 74 %
DLCO cor: 15.05 ml/min/mmHg
DLCO unc % pred: 74 %
DLCO unc: 15.05 ml/min/mmHg
FEF 25-75 Post: 1.36 L/sec
FEF 25-75 Pre: 1.5 L/sec
FEF2575-%Change-Post: -9 %
FEF2575-%Pred-Post: 80 %
FEF2575-%Pred-Pre: 89 %
FEV1-%Change-Post: -1 %
FEV1-%Pred-Post: 78 %
FEV1-%Pred-Pre: 78 %
FEV1-Post: 1.75 L
FEV1-Pre: 1.76 L
FEV1FVC-%Change-Post: 0 %
FEV1FVC-%Pred-Pre: 103 %
FEV6-%Change-Post: -1 %
FEV6-%Pred-Post: 79 %
FEV6-%Pred-Pre: 80 %
FEV6-Post: 2.25 L
FEV6-Pre: 2.29 L
FEV6FVC-%Pred-Post: 105 %
FEV6FVC-%Pred-Pre: 105 %
FVC-%Change-Post: -1 %
FVC-%Pred-Post: 75 %
FVC-%Pred-Pre: 76 %
FVC-Post: 2.25 L
FVC-Pre: 2.29 L
Post FEV1/FVC ratio: 78 %
Post FEV6/FVC ratio: 100 %
Pre FEV1/FVC ratio: 77 %
Pre FEV6/FVC Ratio: 100 %
RV % pred: 71 %
RV: 1.73 L
TLC % pred: 86 %
TLC: 4.63 L

## 2022-01-19 NOTE — Progress Notes (Signed)
Full PFT performed today. °

## 2022-01-19 NOTE — Patient Instructions (Signed)
Full PFT performed today. °

## 2022-01-19 NOTE — Patient Instructions (Signed)
I will see you back in about 6 months  Call us with any significant concerns  Use albuterol as needed  Continue using your ventilator at night  Continue graded exercises, increase activity as tolerated

## 2022-01-19 NOTE — Progress Notes (Signed)
Amanda Bauer    [id number]    1945/07/09  Primary Care Physician:Prochnau, Chrys Racer, MD  Referring Physician: Ernestene Kiel, MD [address] [place],  [place] [address]  Chief complaint:    Follow-up for sleep apnea Chronic respiratory failure PFT follow-up  HPI:  Patient had a PFT today which showed no significant obstructive disease, no restriction, mild reduction in diffusing capacity  She has a trilogy ventilator that she uses nightly Sleep study in 2021 revealed moderate obstructive sleep apnea Could not tolerate CPAP in the past with a pressure of 13, currently on a trilogy ventilator and she is tolerating this well -Not having any issues with the trilogy ventilator and uses it nightly -Gets about 8 hours of sleep nightly -Usually feels rested about 4 to 5 days out of the week -Does not take daytime naps -Tries to stay active -She does not like the ventilator but has been compliant with use  Usually goes to bed between 10 and 11 Might take up to 2 hours to fall asleep sometimes About 3 awakenings Final wake up time between 10 AM and 11 AM  Has a son who has obstructive sleep apnea  About 20-30 pound weight gain recently -Her weight has been stable  Admits to occasional dryness of the mouth in the morning No morning headaches No night sweats Memory is good Has no difficulty driving for long hours  Outpatient Encounter Medications as of 01/19/2022  Medication Sig   amLODipine (NORVASC) 5 MG tablet Take 1 tablet (5 mg total) by mouth daily.   atorvastatin (LIPITOR) 10 MG tablet Take 10 mg by mouth daily.   carvedilol (COREG) 12.5 MG tablet Take 1 tablet (12.5 mg total) by mouth 2 (two) times daily. Patient needs an appointment for further refills. 2 nd attempt   furosemide (LASIX) 40 MG tablet Take 1 tablet (40 mg total) by mouth daily.   levothyroxine (SYNTHROID) 112 MCG tablet Take 112 mcg by mouth daily before breakfast.   loratadine (CLARITIN) 10 MG  tablet Take 10 mg by mouth daily as needed for allergies.   valsartan (DIOVAN) 80 MG tablet Take 80 mg by mouth 2 (two) times daily.   No facility-administered encounter medications on file as of 01/19/2022.    Allergies as of 01/19/2022 - Review Complete 06/19/2021  Allergen Reaction Noted   Aspirin Other (See Comments) 02/20/2020   Tramadol Other (See Comments) 02/20/2020   Pravastatin Nausea Only 02/20/2020    Past Medical History:  Diagnosis Date   Anemia    Barrett esophagus    Chronic kidney disease    Stage 3   Colon polyps    Diverticula of colon    Left colon   Hemorrhoids    internal    Hiatal hernia    Hyperlipidemia    Hypertension    Hypothyroidism 02/20/2020   Osteoarthritis 02/20/2020   Sleep apnea    unconfirmed, undergoing workup    Past Surgical History:  Procedure Laterality Date   ABDOMINAL HYSTERECTOMY     complete hysterectomy      Family History  Problem Relation Age of Onset   Hypertension Mother    Stomach cancer Mother    Stroke Father    Cerebral aneurysm Other    Cancer Other        GI Tract    Social History   Socioeconomic History   Marital status: Married    Spouse name: Shiva Eklof   Number  of children: 3   Years of education: Not on file   Highest education level: Not on file  Occupational History   Not on file  Tobacco Use   Smoking status: Former    Types: Cigarettes   Smokeless tobacco: Former    Types: Snuff    Quit date: 05/17/1989   Tobacco comments:    Family previously denied smoking, patient states quit many years ago  Vaping Use   Vaping Use: Never used  Substance and Sexual Activity   Alcohol use: Yes    Comment: occasional   Drug use: Never   Sexual activity: Not on file  Other Topics Concern   Not on file  Social History Narrative   Not on file   Social Determinants of Health   Financial Resource Strain: Not on file  Food Insecurity: Not on file  Transportation Needs: Not on file  Physical  Activity: Not on file  Stress: Not on file  Social Connections: Not on file  Intimate Partner Violence: Not on file    Review of Systems  Constitutional: Negative.  Negative for fatigue.  Respiratory:  Positive for apnea. Negative for shortness of breath.   Psychiatric/Behavioral:  Positive for sleep disturbance.     There were no vitals filed for this visit.    Physical Exam Constitutional:      Appearance: She is obese.  HENT:     Nose: Nose normal.  Eyes:     General:        Left eye: No discharge.     Pupils: Pupils are equal, round, and reactive to light.  Cardiovascular:     Rate and Rhythm: Normal rate and regular rhythm.     Pulses: Normal pulses.     Heart sounds: Normal heart sounds. No murmur heard.    No friction rub.  Pulmonary:     Effort: Pulmonary effort is normal. No respiratory distress.     Breath sounds: No stridor. No wheezing or rhonchi.  Musculoskeletal:     Cervical back: No rigidity or tenderness.  Neurological:     Mental Status: She is alert.  Psychiatric:        Mood and Affect: Mood normal.       02/26/2020    9:00 AM  Results of the Epworth flowsheet  Sitting and reading 0  Watching TV 1  Sitting, inactive in a public place (e.g. a theatre or a meeting) 0  As a passenger in a car for an hour without a break 0  Lying down to rest in the afternoon when circumstances permit 0  Sitting and talking to someone 0  Sitting quietly after a lunch without alcohol 1  In a car, while stopped for a few minutes in traffic 0  Total score 2    Sleep study Reviewed and shows moderate obstructive sleep apnea Titrated to a pressure of 13 with good resolution of events  Compliance data reveals 97% compliance Average use of 9 hours 18 minutes Residual AHI of 0.1  PFT shows no obstruction, no significant bronchodilator response, no restriction, mildly reduced diffusing capacity  Download from trilogy ventilator revealed 97% compliance Average  use of 8 hours 33 minutes Residual AHI of 0.1  Assessment:  Moderate obstructive sleep apnea Chronic respiratory failure -On trilogy ventilator at night -Tolerating trilogy well -GERD activity levels during the day  Obesity -Encouraged to continue to stay active -Weight loss as tolerated  Nonrestorative sleep -Has no significant daytime symptoms  Plan/Recommendations:  Continue trilogy ventilator use at night -Call us with any significant concerns  Class II obesity -Encouraged to focus on weight loss efforts  She does have albuterol that she rarely uses  Follow-up in 6 months  Encouraged to call us with any significant concerns Virl Diamond MD [facility] 01/19/2022, 9:42 AM  CC: Philemon Kingdom, MD

## 2022-02-09 DIAGNOSIS — K2270 Barrett's esophagus without dysplasia: Secondary | ICD-10-CM | POA: Diagnosis not present

## 2022-02-09 DIAGNOSIS — J9600 Acute respiratory failure, unspecified whether with hypoxia or hypercapnia: Secondary | ICD-10-CM | POA: Diagnosis not present

## 2022-02-24 ENCOUNTER — Telehealth: Payer: Self-pay | Admitting: Pulmonary Disease

## 2022-02-24 NOTE — Telephone Encounter (Signed)
Printed off last office note from September visit. Faxed to [facility] at [place]. Nothing further needed

## 2022-03-11 DIAGNOSIS — J9600 Acute respiratory failure, unspecified whether with hypoxia or hypercapnia: Secondary | ICD-10-CM | POA: Diagnosis not present

## 2022-03-11 DIAGNOSIS — K2270 Barrett's esophagus without dysplasia: Secondary | ICD-10-CM | POA: Diagnosis not present

## 2022-04-02 DIAGNOSIS — Z1231 Encounter for screening mammogram for malignant neoplasm of breast: Secondary | ICD-10-CM | POA: Diagnosis not present

## 2022-04-11 DIAGNOSIS — K2270 Barrett's esophagus without dysplasia: Secondary | ICD-10-CM | POA: Diagnosis not present

## 2022-04-11 DIAGNOSIS — J9600 Acute respiratory failure, unspecified whether with hypoxia or hypercapnia: Secondary | ICD-10-CM | POA: Diagnosis not present

## 2022-04-27 DIAGNOSIS — G4730 Sleep apnea, unspecified: Secondary | ICD-10-CM | POA: Diagnosis not present

## 2022-04-27 DIAGNOSIS — R7303 Prediabetes: Secondary | ICD-10-CM | POA: Diagnosis not present

## 2022-04-27 DIAGNOSIS — E785 Hyperlipidemia, unspecified: Secondary | ICD-10-CM | POA: Diagnosis not present

## 2022-04-27 DIAGNOSIS — I130 Hypertensive heart and chronic kidney disease with heart failure and stage 1 through stage 4 chronic kidney disease, or unspecified chronic kidney disease: Secondary | ICD-10-CM | POA: Diagnosis not present

## 2022-04-27 DIAGNOSIS — N1832 Chronic kidney disease, stage 3b: Secondary | ICD-10-CM | POA: Diagnosis not present

## 2022-04-27 DIAGNOSIS — I5032 Chronic diastolic (congestive) heart failure: Secondary | ICD-10-CM | POA: Diagnosis not present

## 2022-05-11 DIAGNOSIS — J9600 Acute respiratory failure, unspecified whether with hypoxia or hypercapnia: Secondary | ICD-10-CM | POA: Diagnosis not present

## 2022-05-11 DIAGNOSIS — K2270 Barrett's esophagus without dysplasia: Secondary | ICD-10-CM | POA: Diagnosis not present

## 2022-05-19 DIAGNOSIS — E662 Morbid (severe) obesity with alveolar hypoventilation: Secondary | ICD-10-CM | POA: Diagnosis not present

## 2022-05-19 DIAGNOSIS — J9610 Chronic respiratory failure, unspecified whether with hypoxia or hypercapnia: Secondary | ICD-10-CM | POA: Diagnosis not present

## 2022-06-11 DIAGNOSIS — J9600 Acute respiratory failure, unspecified whether with hypoxia or hypercapnia: Secondary | ICD-10-CM | POA: Diagnosis not present

## 2022-06-11 DIAGNOSIS — K2270 Barrett's esophagus without dysplasia: Secondary | ICD-10-CM | POA: Diagnosis not present

## 2022-06-12 IMAGING — US US RENAL
1 series · 14 of 25 positions shown · non-contrast
Comparison: CT 11/12/2013

CLINICAL DATA: Chronic kidney disease

EXAM:
RENAL / URINARY TRACT ULTRASOUND COMPLETE

[Series 1: us renal · 0.20mm/px · 14 of 25 slices shown]
[im 1/25]
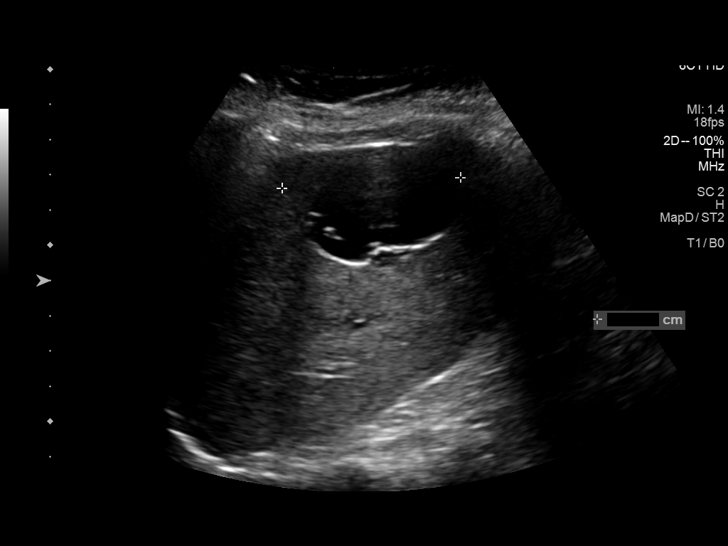
[im 3/25]
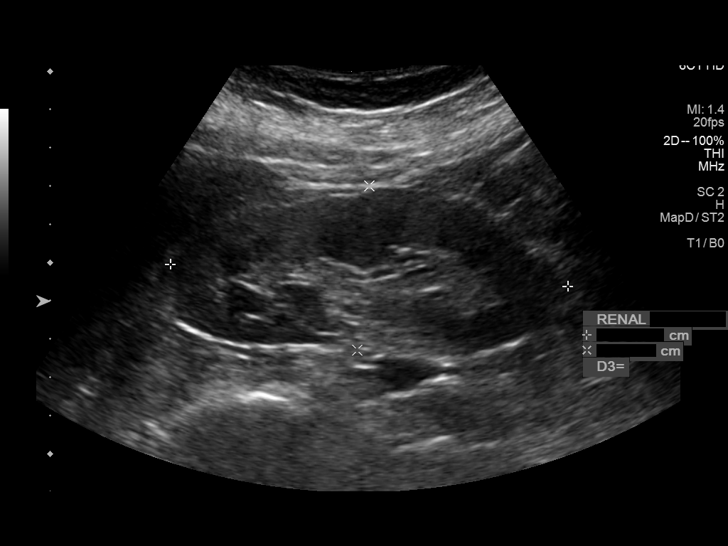
[im 5/25]
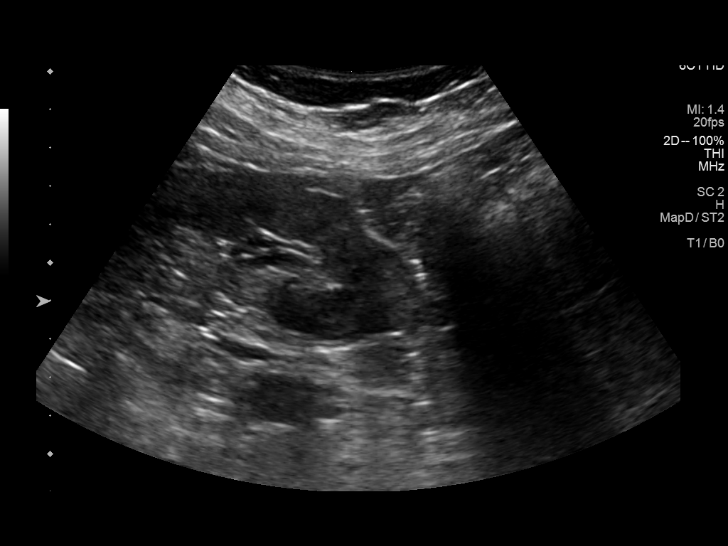
[im 7/25]
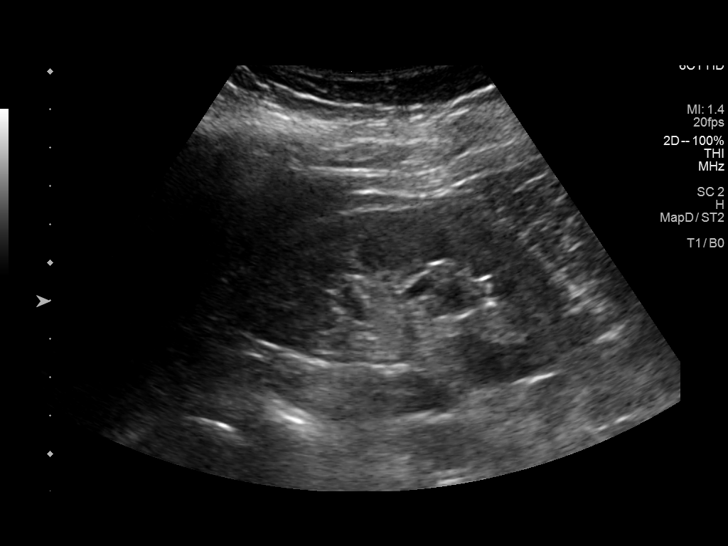
[im 9/25]
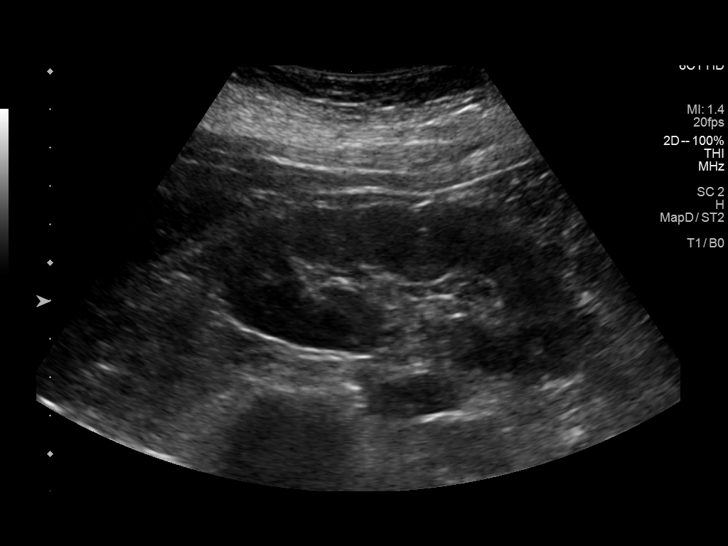
[im 10/25]
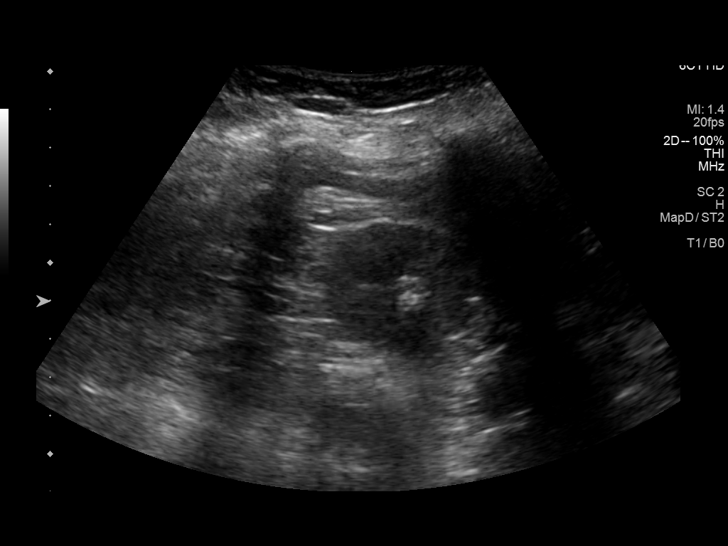
[im 12/25]
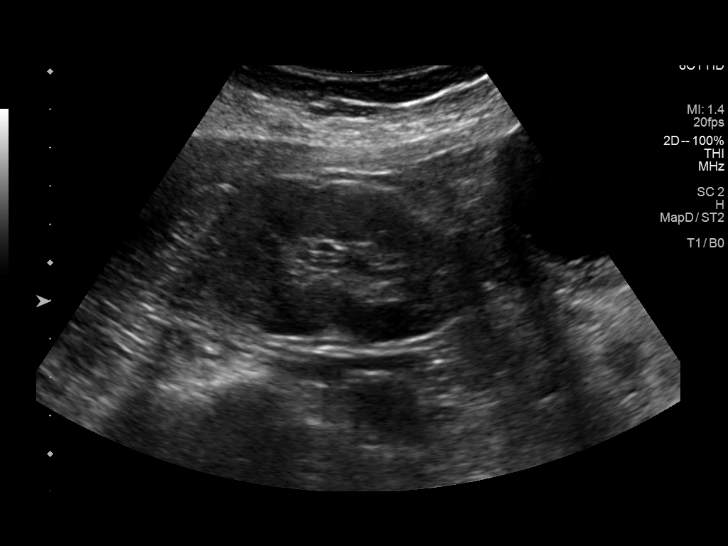
[im 14/25]
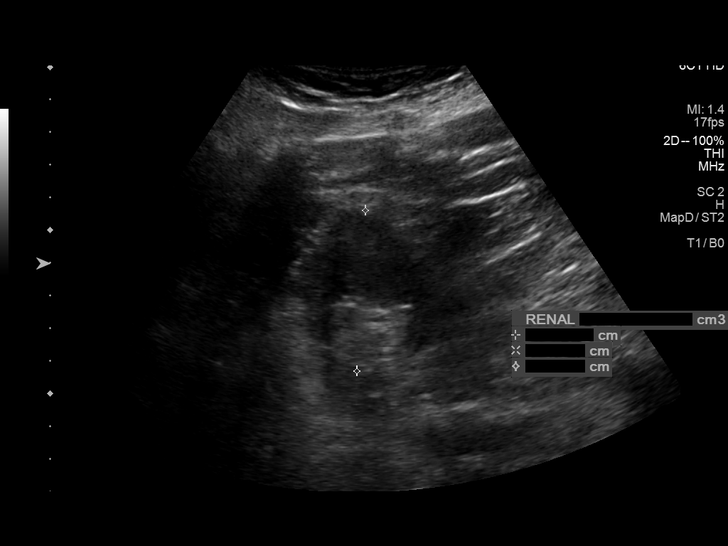
[im 16/25]
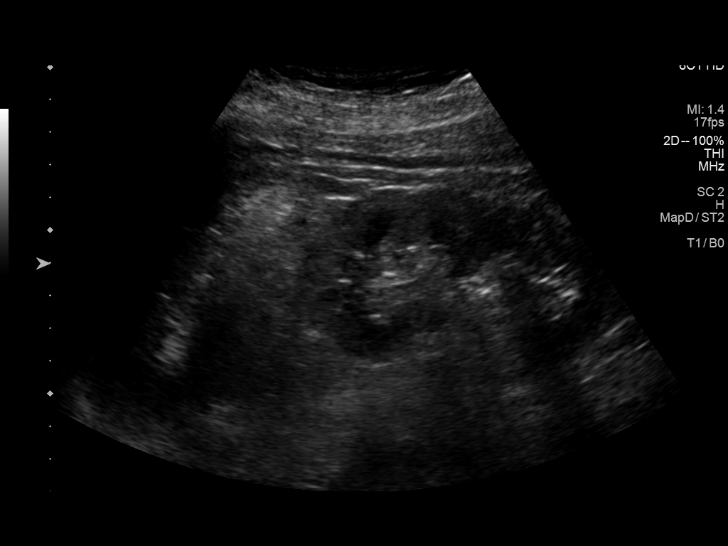
[im 17/25]
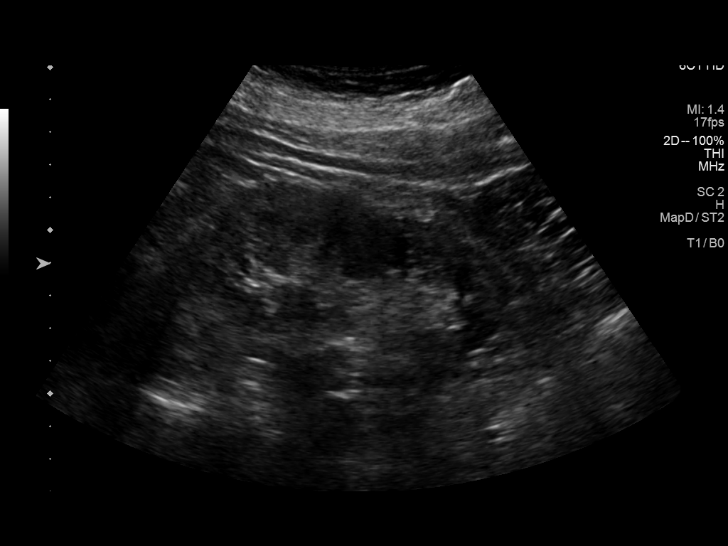
[im 19/25]
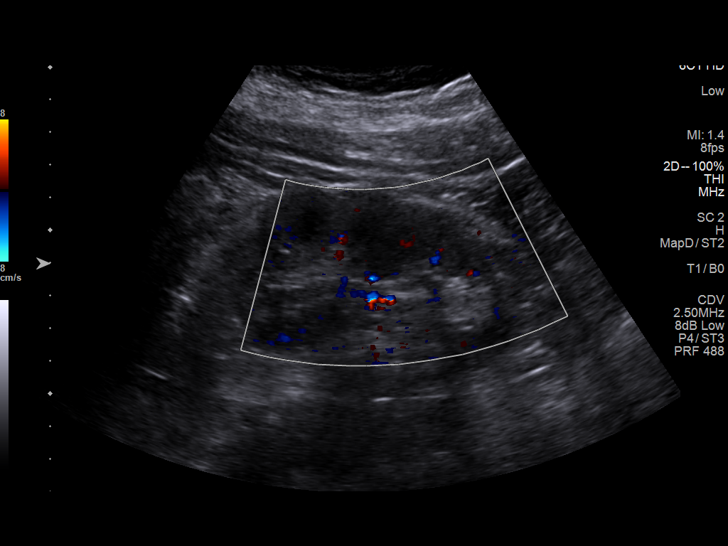
[im 21/25]
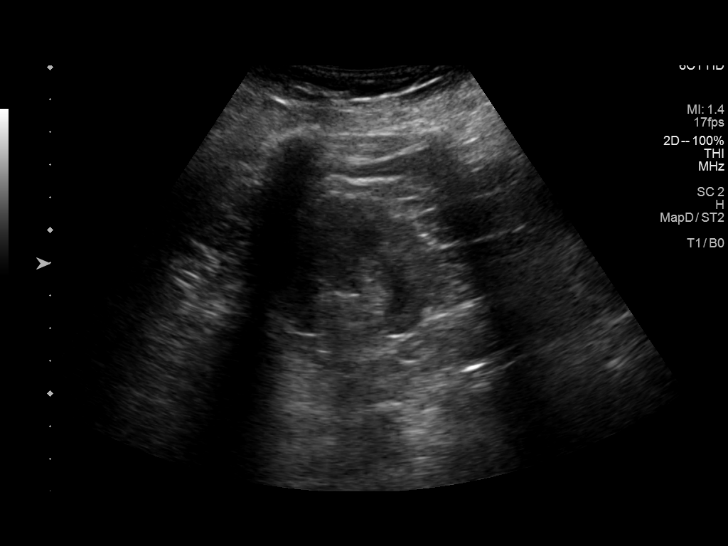
[im 23/25]
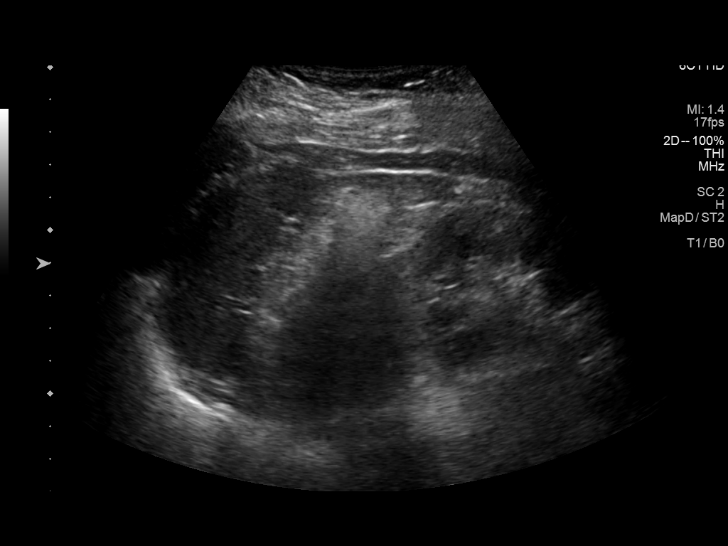
[im 25/25]
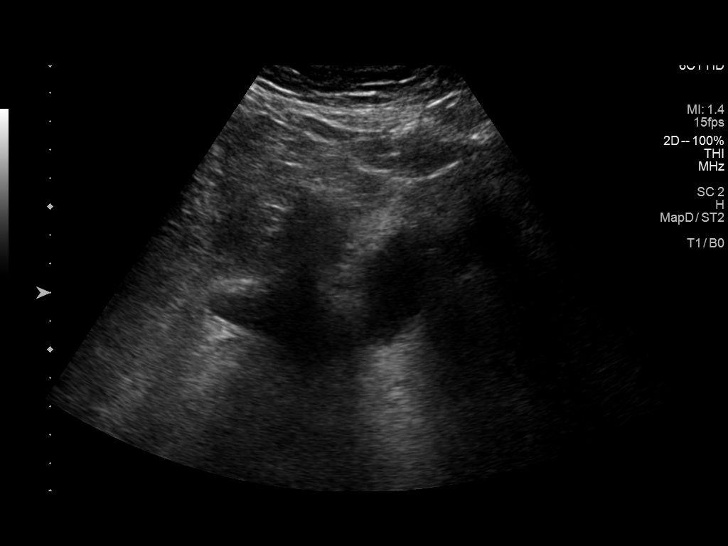

[14 of 25 positions shown; findings below may reference images not displayed]

FINDINGS: Right Kidney:

Renal measurements: 10.4 x 4.3 x 4.5 cm = volume: 105 mL.
Echogenicity within normal limits. No mass or hydronephrosis
visualized.

Left Kidney:

Renal measurements: 10.3 x 4.5 x 4.9 cm = volume: 120 mL.
Echogenicity within normal limits. No mass or hydronephrosis
visualized.

Bladder:

Appears normal for degree of bladder distention.

Other:

Incidental note made of septated liver cyst measuring 5 cm, appears
to correspond to hepatic cyst on previous CT but is slightly
increased in size.
IMPRESSION: Normal ultrasound appearance of the kidneys.

## 2022-06-19 DIAGNOSIS — J9610 Chronic respiratory failure, unspecified whether with hypoxia or hypercapnia: Secondary | ICD-10-CM | POA: Diagnosis not present

## 2022-06-19 DIAGNOSIS — E662 Morbid (severe) obesity with alveolar hypoventilation: Secondary | ICD-10-CM | POA: Diagnosis not present

## 2022-07-07 IMAGING — DX DG CHEST 2V
2 series · 2 of 2 positions shown · non-contrast
Comparison: May 29, 2012

CLINICAL DATA: Shortness of breath.

EXAM:
CHEST - 2 VIEW

[chest lat]
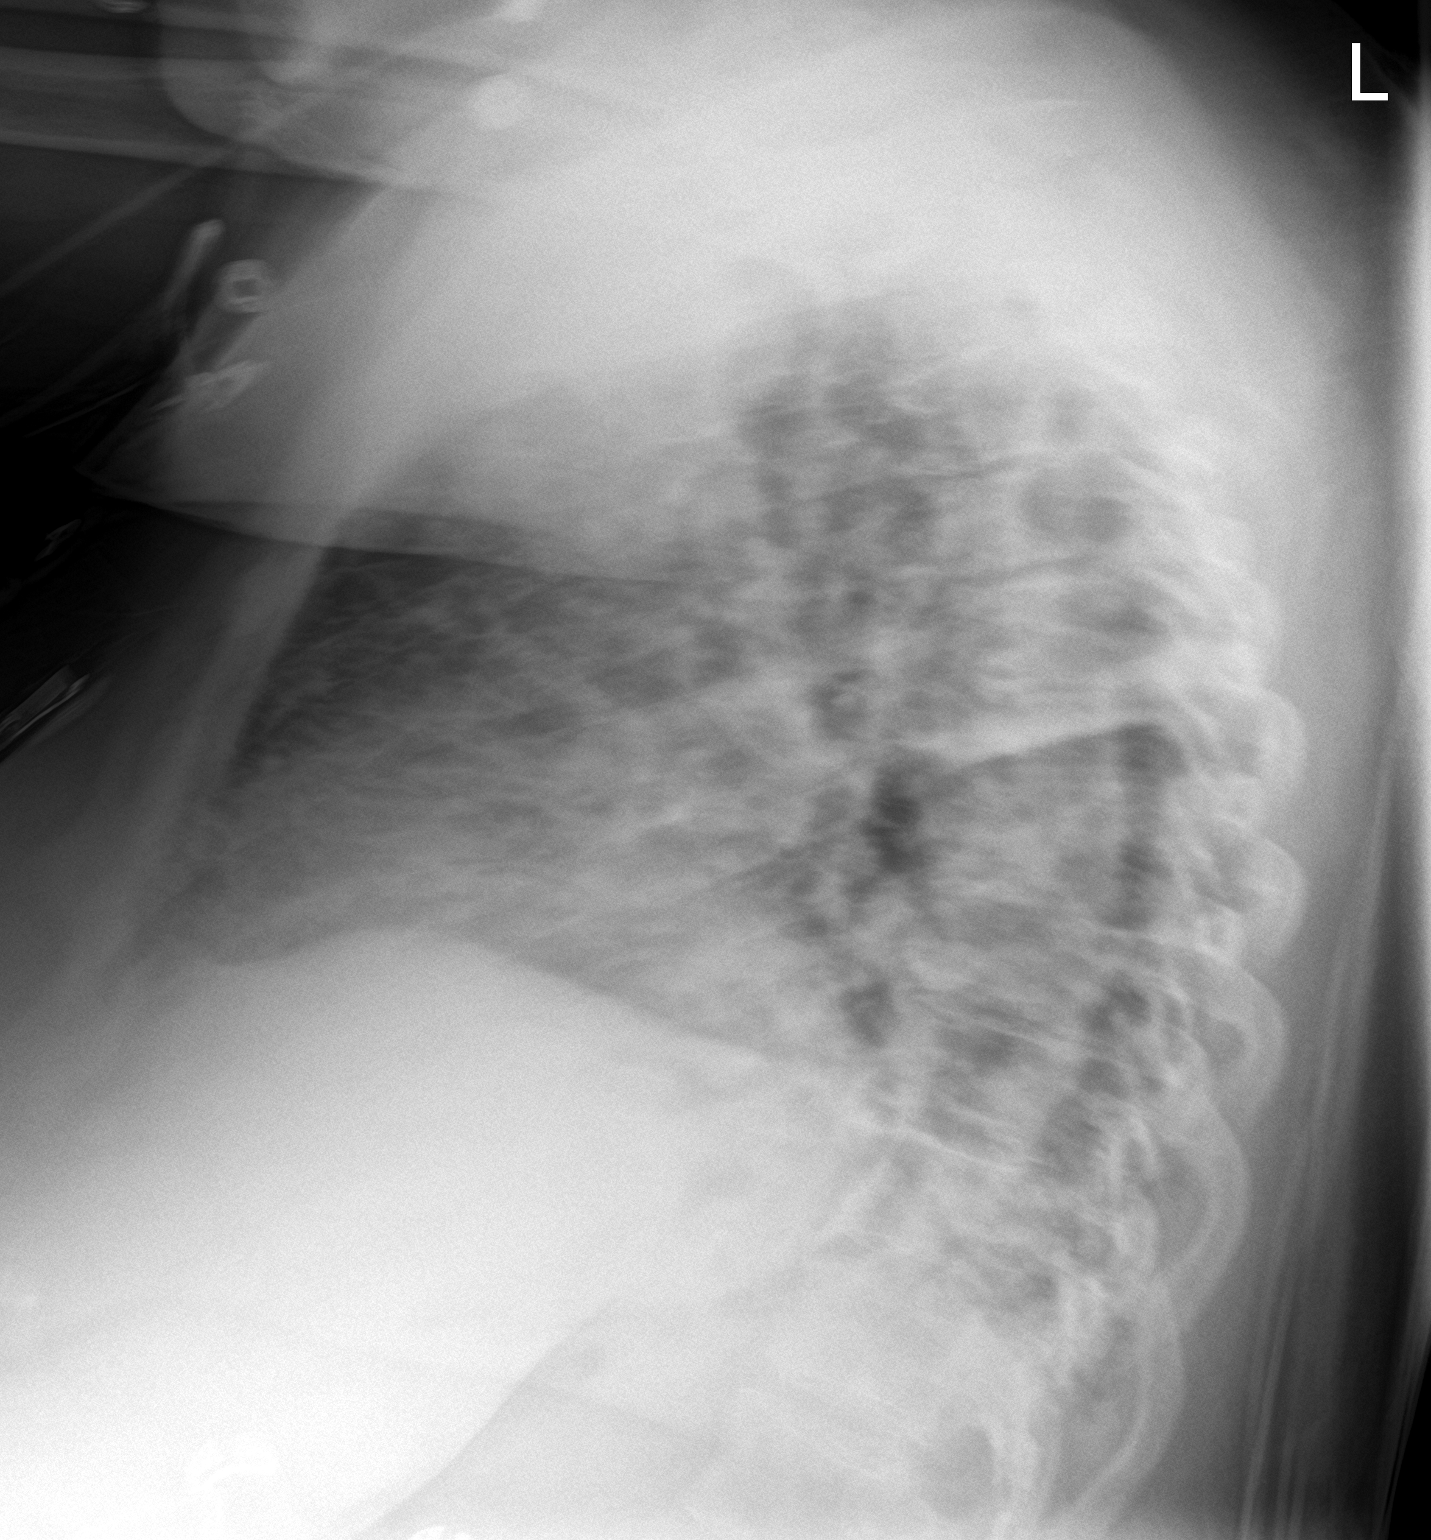

[chest ap]
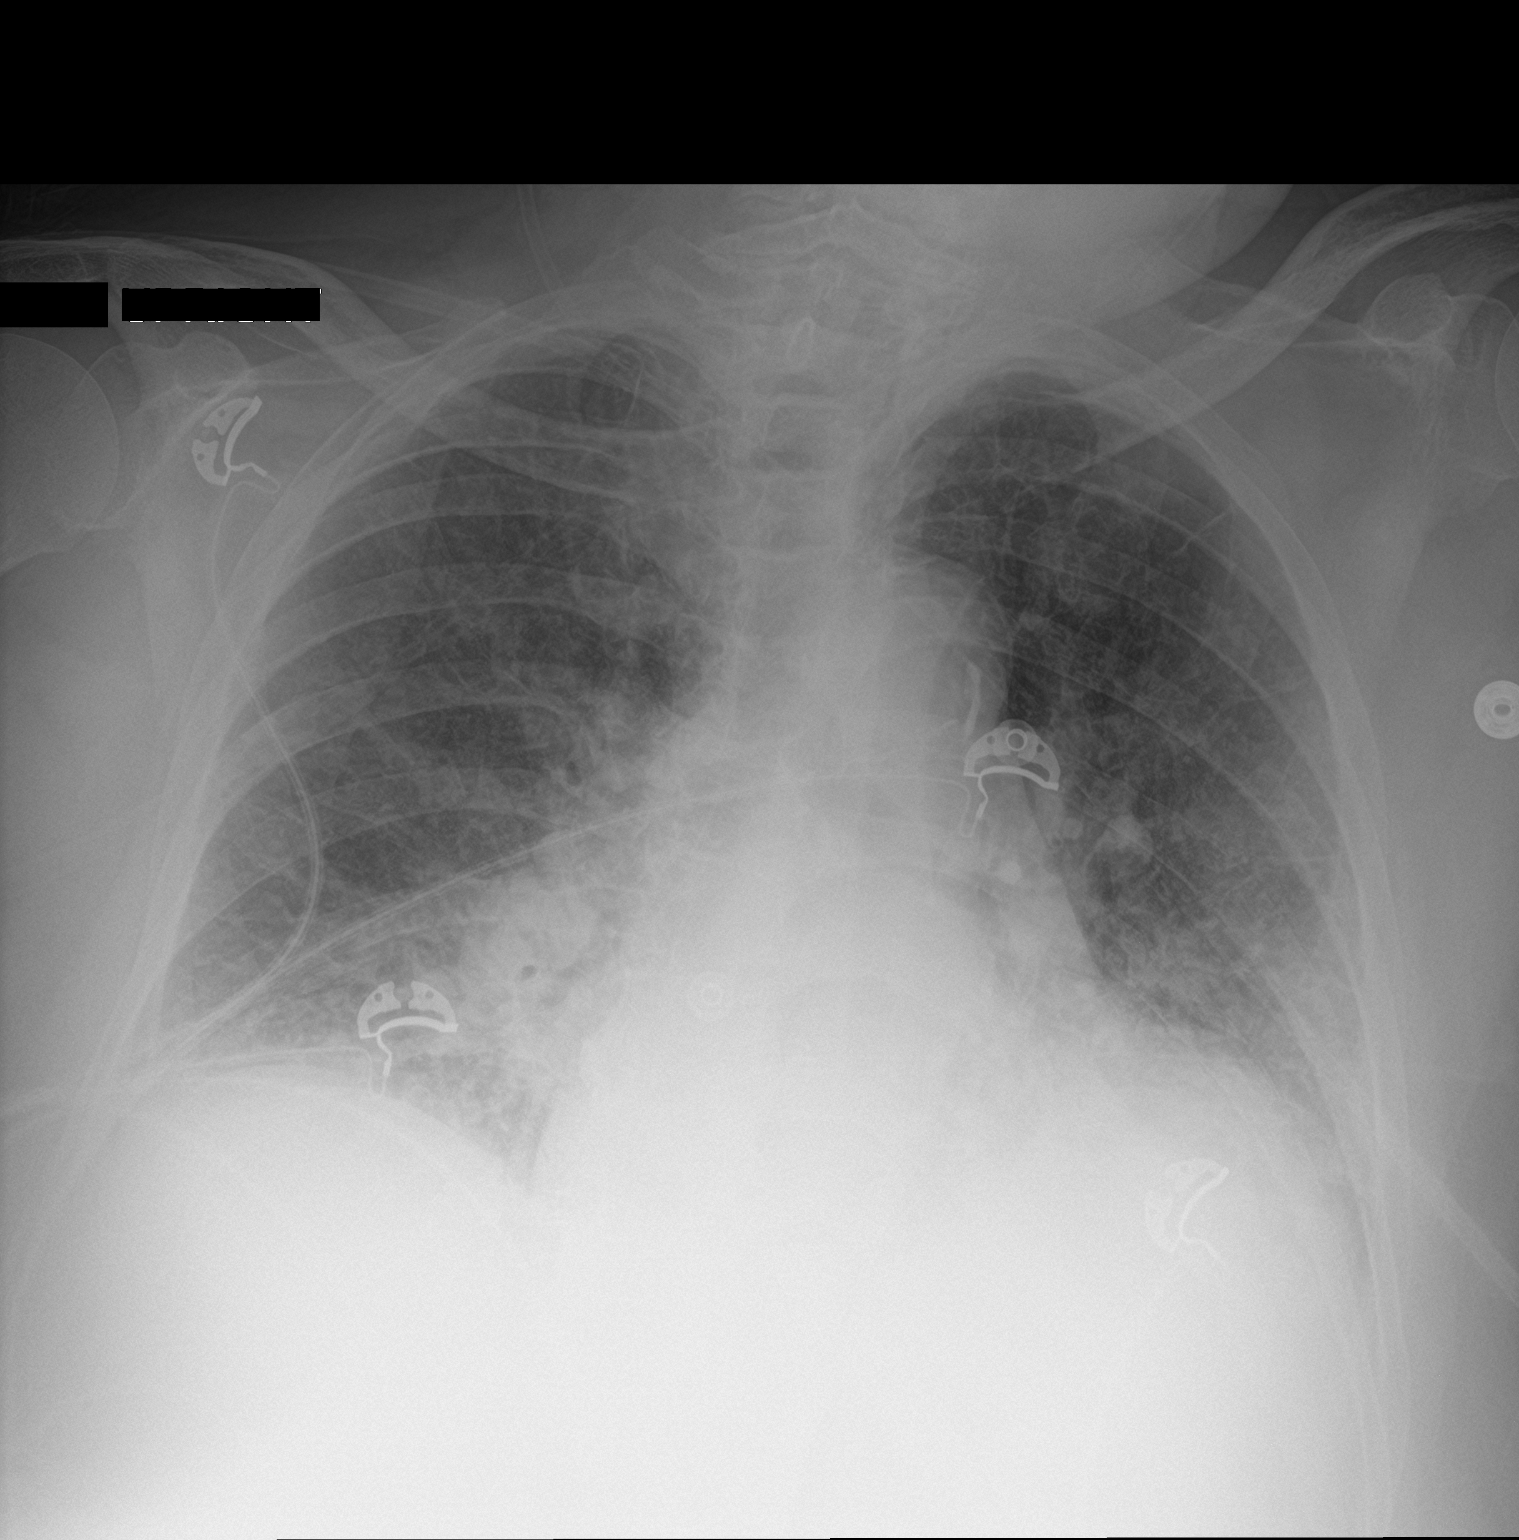

[2 of 2 positions shown; findings below may reference images not displayed]

FINDINGS: Cardiomegaly. The hila and mediastinum are unremarkable. Bilateral
interstitial opacities, most prominent the bases. No pneumothorax.
No other acute abnormalities.
IMPRESSION: Findings are most consistent with cardiomegaly and pulmonary edema.

## 2022-07-08 IMAGING — DX DG CHEST 1V PORT
1 series · 1 of 1 positions shown · non-contrast
Comparison: April 05, 2020

CLINICAL DATA: Respiratory failure

EXAM:
PORTABLE CHEST 1 VIEW

[chest]
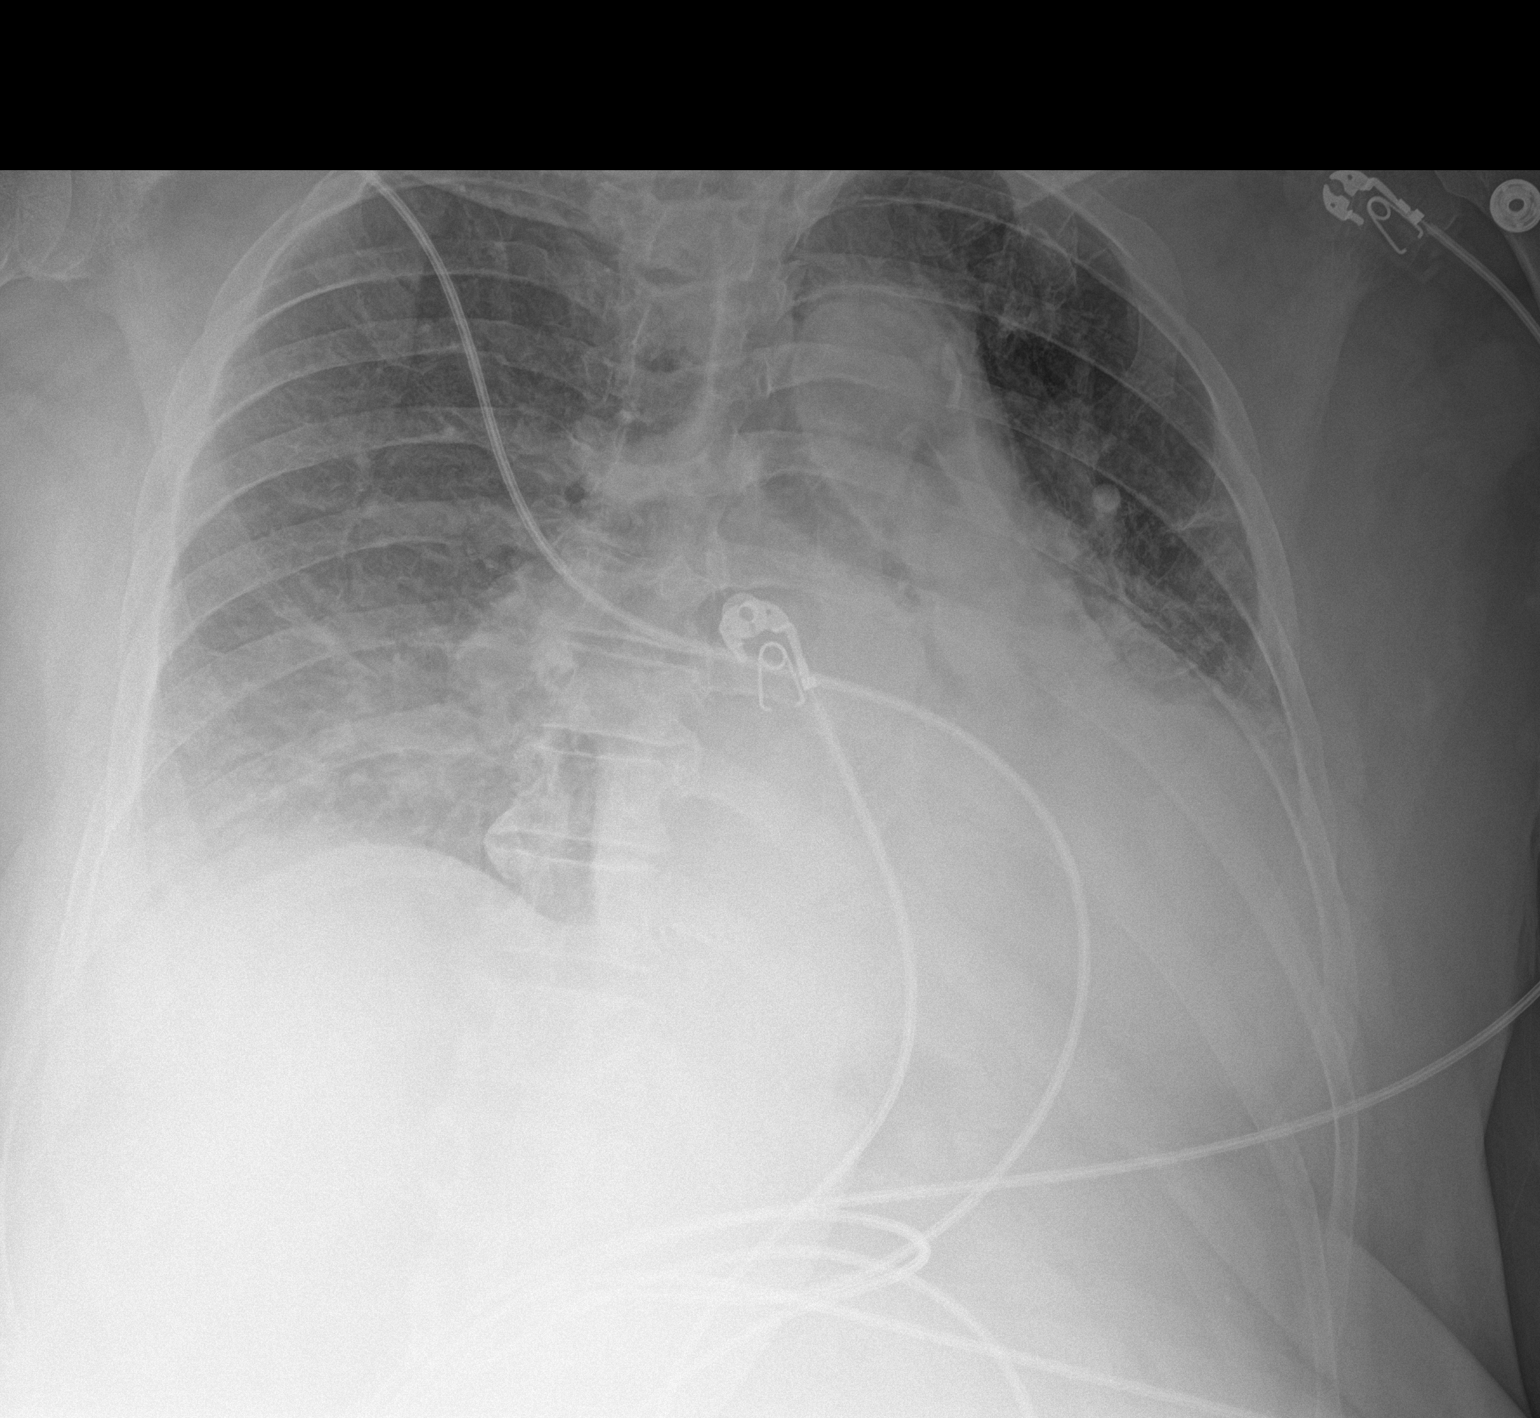

[1 of 1 positions shown; findings below may reference images not displayed]

FINDINGS: Stable cardiomegaly. The hila and mediastinum are unchanged. Dense
left retrocardiac opacity is more pronounced in the interval.
Diffuse interstitial opacities are identified. Mild atelectasis in
the right base. No pneumothorax. No other changes.
IMPRESSION: 1. Findings are most consistent with cardiomegaly and mild edema.
2. There is also a dense left retrocardiac infiltrate which could
represent atelectasis or pneumonia. This finding is more pronounced
in the interval. Recommend clinical correlation and attention on
follow-up.

## 2022-07-09 IMAGING — DX DG CHEST 1V PORT
1 series · 1 of 1 positions shown · non-contrast
Comparison: One-view chest x-ray 04/06/2020

CLINICAL DATA: Acute respiratory failure hypoxia. Shortness of
breath.

EXAM:
PORTABLE CHEST 1 VIEW

[chest ap]
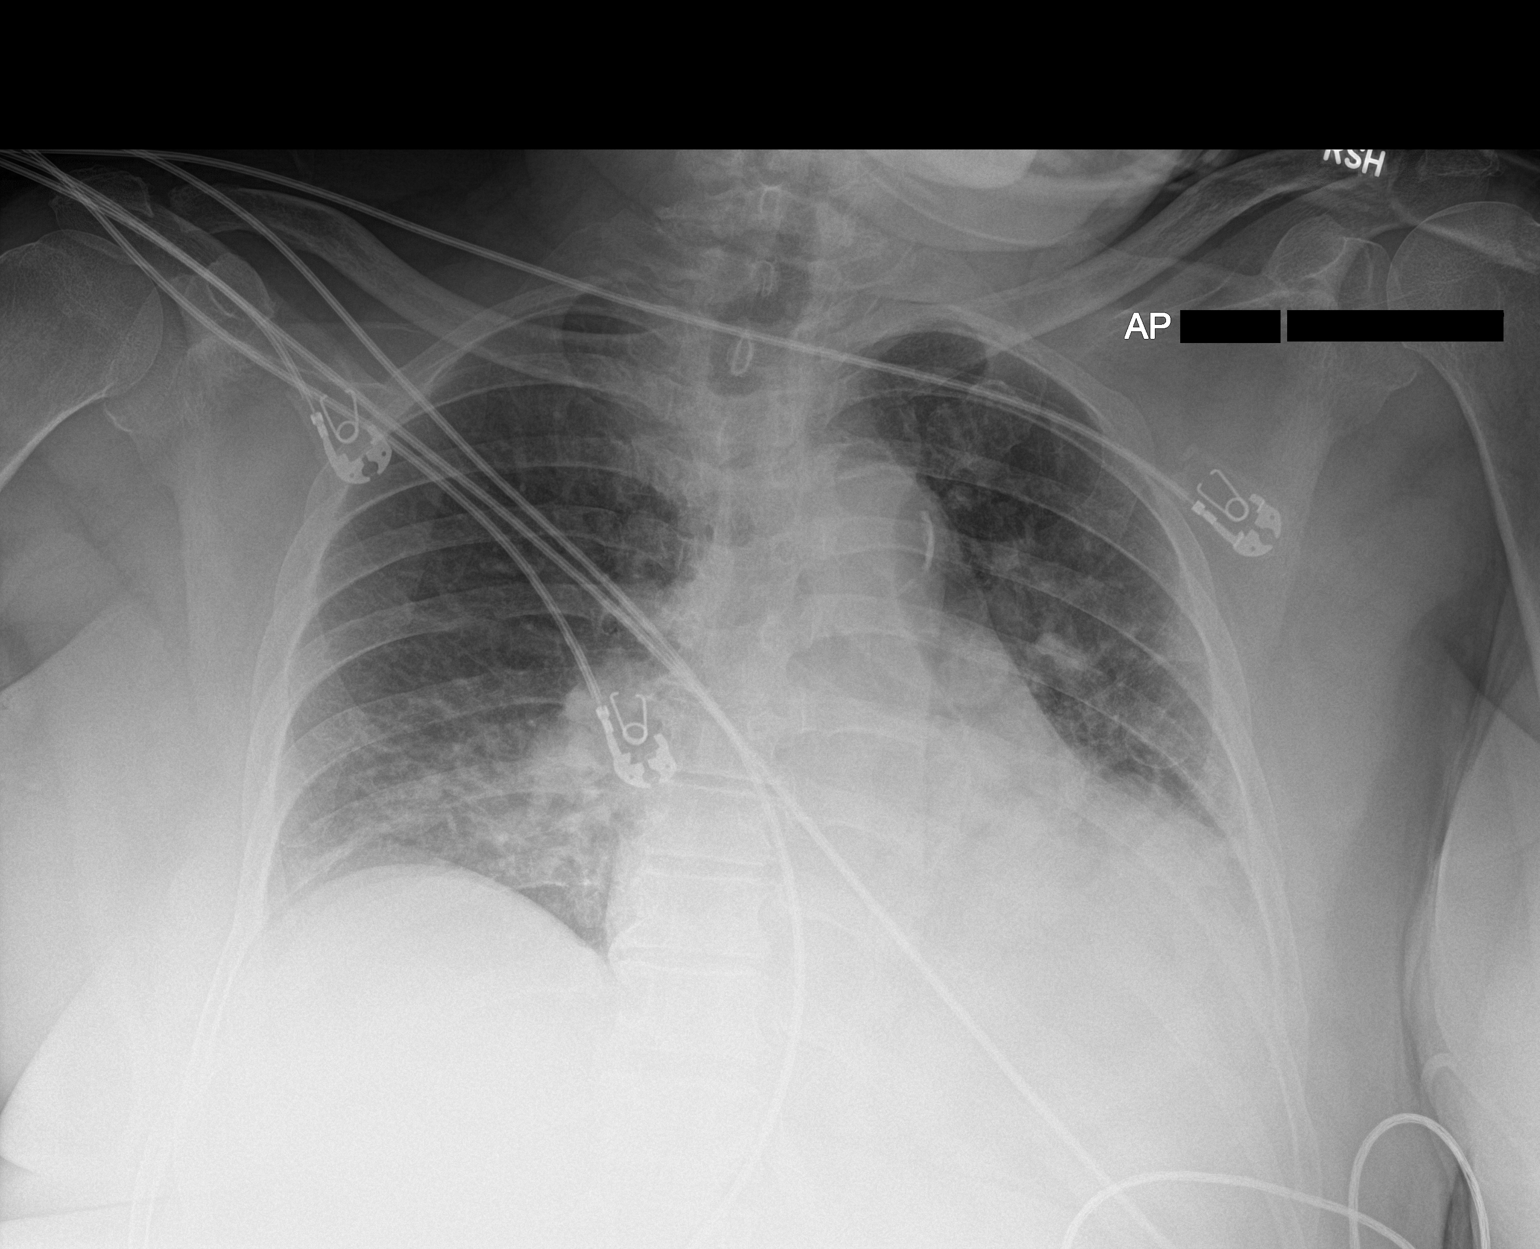

[1 of 1 positions shown; findings below may reference images not displayed]

FINDINGS: Heart size is normal. Atherosclerotic calcifications are present at
the arch. Volumes remain low. Aeration is improving. Bibasilar
airspace disease remains, left greater than right.
IMPRESSION: Improving aeration with persistent bibasilar airspace disease, left
greater than right.

## 2022-07-12 DIAGNOSIS — J9600 Acute respiratory failure, unspecified whether with hypoxia or hypercapnia: Secondary | ICD-10-CM | POA: Diagnosis not present

## 2022-07-12 DIAGNOSIS — K2270 Barrett's esophagus without dysplasia: Secondary | ICD-10-CM | POA: Diagnosis not present

## 2022-07-13 ENCOUNTER — Ambulatory Visit: Payer: Medicare PPO | Admitting: Pulmonary Disease

## 2022-07-13 ENCOUNTER — Encounter: Payer: Self-pay | Admitting: Pulmonary Disease

## 2022-07-13 VITALS — BP 130/64 | HR 61 | Ht 66.0 in | Wt 232.0 lb

## 2022-07-13 DIAGNOSIS — R0602 Shortness of breath: Secondary | ICD-10-CM

## 2022-07-13 DIAGNOSIS — G4733 Obstructive sleep apnea (adult) (pediatric): Secondary | ICD-10-CM

## 2022-07-13 NOTE — Progress Notes (Signed)
Amanda Bauer    [id number]    10-Aug-1945  Primary Care Physician:Prochnau, Chrys Racer, MD  Referring Physician: Ernestene Kiel, MD [address] [place],  [place] [address]  Chief complaint:    Follow-up for sleep apnea Chronic respiratory failure  HPI:  Has been doing relatively well No recent exacerbation of symptoms  Tries to stay active Aware that she should be exercising regularly, not doing it yet but working on it  Continues to use a trilogy ventilator, compliant with use Wakes up feeling like she has had a good nights rest Uses it nightly without fail  She gets about 8 hours of sleep nightly wakes up feeling rejuvenated  Usually goes to bed between 10 and 11 Might take up to 2 hours to fall asleep sometimes About 3 awakenings Final wake up time between 10 AM and 11 AM  Has a son who has obstructive sleep apnea  Weight has been stable recently  Admits to occasional dryness of the mouth in the morning No morning headaches No night sweats Memory is good Has no difficulty driving for long hours  Outpatient Encounter Medications as of 07/13/2022  Medication Sig   amLODipine (NORVASC) 5 MG tablet Take 1 tablet (5 mg total) by mouth daily.   atorvastatin (LIPITOR) 10 MG tablet Take 10 mg by mouth daily.   carvedilol (COREG) 12.5 MG tablet Take 1 tablet (12.5 mg total) by mouth 2 (two) times daily. Patient needs an appointment for further refills. 2 nd attempt   cholecalciferol (VITAMIN D3) 25 MCG (1000 UNIT) tablet Take 1,000 Units by mouth daily.   FARXIGA 10 MG TABS tablet Take 10 mg by mouth daily.   furosemide (LASIX) 40 MG tablet Take 1 tablet (40 mg total) by mouth daily.   levothyroxine (SYNTHROID) 112 MCG tablet Take 112 mcg by mouth daily before breakfast.   loratadine (CLARITIN) 10 MG tablet Take 10 mg by mouth daily as needed for allergies.   valsartan (DIOVAN) 80 MG tablet Take 80 mg by mouth 2 (two) times daily.   No facility-administered  encounter medications on file as of 07/13/2022.    Allergies as of 07/13/2022 - Review Complete 07/13/2022  Allergen Reaction Noted   Aspirin Other (See Comments) 02/20/2020   Tramadol Other (See Comments) 02/20/2020   Pravastatin Nausea Only 02/20/2020    Past Medical History:  Diagnosis Date   Anemia    Barrett esophagus    Chronic kidney disease    Stage 3   Colon polyps    Diverticula of colon    Left colon   Hemorrhoids    internal    Hiatal hernia    Hyperlipidemia    Hypertension    Hypothyroidism 02/20/2020   Osteoarthritis 02/20/2020   Sleep apnea    unconfirmed, undergoing workup    Past Surgical History:  Procedure Laterality Date   ABDOMINAL HYSTERECTOMY     complete hysterectomy      Family History  Problem Relation Age of Onset   Hypertension Mother    Stomach cancer Mother    Stroke Father    Cerebral aneurysm Other    Cancer Other        GI Tract    Social History   Socioeconomic History   Marital status: Married    Spouse name: Maleiah Fiechtner   Number of children: 3   Years of education: Not on file   Highest education level: Not on file  Occupational History  Not on file  Tobacco Use   Smoking status: Former    Types: Cigarettes   Smokeless tobacco: Former    Types: Snuff    Quit date: 05/17/1989   Tobacco comments:    Family previously denied smoking, patient states quit many years ago  Vaping Use   Vaping Use: Never used  Substance and Sexual Activity   Alcohol use: Yes    Comment: occasional   Drug use: Never   Sexual activity: Not on file  Other Topics Concern   Not on file  Social History Narrative   Not on file   Social Determinants of Health   Financial Resource Strain: Not on file  Food Insecurity: Not on file  Transportation Needs: Not on file  Physical Activity: Not on file  Stress: Not on file  Social Connections: Not on file  Intimate Partner Violence: Not on file    Review of Systems  Constitutional:  Negative.  Negative for fatigue.  Respiratory:  Positive for apnea. Negative for shortness of breath.   Psychiatric/Behavioral:  Positive for sleep disturbance.     Vitals:   07/13/22 1539  BP: 130/64  Pulse: 61  SpO2: 97%     Physical Exam Constitutional:      Appearance: She is obese.  HENT:     Head: Normocephalic.     Nose: Nose normal.  Eyes:     General:        Left eye: No discharge.     Pupils: Pupils are equal, round, and reactive to light.  Cardiovascular:     Rate and Rhythm: Normal rate and regular rhythm.     Pulses: Normal pulses.     Heart sounds: Normal heart sounds. No murmur heard.    No friction rub.  Pulmonary:     Effort: Pulmonary effort is normal. No respiratory distress.     Breath sounds: No stridor. No wheezing or rhonchi.  Musculoskeletal:     Cervical back: No rigidity or tenderness.  Neurological:     Mental Status: She is alert.  Psychiatric:        Mood and Affect: Mood normal.       02/26/2020    9:00 AM  Results of the Epworth flowsheet  Sitting and reading 0  Watching TV 1  Sitting, inactive in a public place (e.g. a theatre or a meeting) 0  As a passenger in a car for an hour without a break 0  Lying down to rest in the afternoon when circumstances permit 0  Sitting and talking to someone 0  Sitting quietly after a lunch without alcohol 1  In a car, while stopped for a few minutes in traffic 0  Total score 2    Sleep study Reviewed and shows moderate obstructive sleep apnea Titrated to a pressure of 13 with good resolution of events  Compliance shows 93% compliance Average use of 8 hours 2 minutes On PS SV Pressure support of 8, respiratory rate of 10 EPAP of 5 Maximum pressure support of 15 -AHI of 0.1  PFT shows no obstruction, no significant bronchodilator response, no restriction, mildly reduced diffusing capacity  Assessment:   Moderate obstructive sleep apnea Chronic respiratory failure -On trilogy  ventilator at night -Tolerating ventilator well -Functioning well with no significant limitations at present  Obesity -Encouraged to continue to stay active -Regular exercises encouraged  Nonrestorative sleep -Denies significant daytime symptoms of present  Plan/Recommendations:  Continue nightly use of trilogy ventilator  Use albuterol  as needed  Call us with significant concerns  Regular exercises  Follow-up in 4 months  Sherrilyn Rist MD [facility] 07/13/2022, 3:46 PM  CC: Ernestene Kiel, MD

## 2022-07-13 NOTE — Patient Instructions (Signed)
Continue using her Trelegy nightly  Regular exercises as tolerated  I will see you in about 4 months  Call with significant concerns

## 2022-08-10 DIAGNOSIS — K2270 Barrett's esophagus without dysplasia: Secondary | ICD-10-CM | POA: Diagnosis not present

## 2022-08-10 DIAGNOSIS — J9600 Acute respiratory failure, unspecified whether with hypoxia or hypercapnia: Secondary | ICD-10-CM | POA: Diagnosis not present

## 2022-09-01 DIAGNOSIS — I5032 Chronic diastolic (congestive) heart failure: Secondary | ICD-10-CM | POA: Diagnosis not present

## 2022-09-01 DIAGNOSIS — N1832 Chronic kidney disease, stage 3b: Secondary | ICD-10-CM | POA: Diagnosis not present

## 2022-09-01 DIAGNOSIS — E039 Hypothyroidism, unspecified: Secondary | ICD-10-CM | POA: Diagnosis not present

## 2022-09-01 DIAGNOSIS — I10 Essential (primary) hypertension: Secondary | ICD-10-CM | POA: Diagnosis not present

## 2022-09-01 DIAGNOSIS — E785 Hyperlipidemia, unspecified: Secondary | ICD-10-CM | POA: Diagnosis not present

## 2022-09-01 DIAGNOSIS — D649 Anemia, unspecified: Secondary | ICD-10-CM | POA: Diagnosis not present

## 2022-09-01 DIAGNOSIS — R7303 Prediabetes: Secondary | ICD-10-CM | POA: Diagnosis not present

## 2022-09-01 DIAGNOSIS — J9612 Chronic respiratory failure with hypercapnia: Secondary | ICD-10-CM | POA: Diagnosis not present

## 2022-09-01 DIAGNOSIS — Z79899 Other long term (current) drug therapy: Secondary | ICD-10-CM | POA: Diagnosis not present

## 2022-09-10 DIAGNOSIS — J9600 Acute respiratory failure, unspecified whether with hypoxia or hypercapnia: Secondary | ICD-10-CM | POA: Diagnosis not present

## 2022-09-10 DIAGNOSIS — K2270 Barrett's esophagus without dysplasia: Secondary | ICD-10-CM | POA: Diagnosis not present

## 2022-10-07 DIAGNOSIS — R7303 Prediabetes: Secondary | ICD-10-CM | POA: Diagnosis not present

## 2022-10-07 DIAGNOSIS — I5032 Chronic diastolic (congestive) heart failure: Secondary | ICD-10-CM | POA: Diagnosis not present

## 2022-10-07 DIAGNOSIS — I130 Hypertensive heart and chronic kidney disease with heart failure and stage 1 through stage 4 chronic kidney disease, or unspecified chronic kidney disease: Secondary | ICD-10-CM | POA: Diagnosis not present

## 2022-10-07 DIAGNOSIS — E785 Hyperlipidemia, unspecified: Secondary | ICD-10-CM | POA: Diagnosis not present

## 2022-10-07 DIAGNOSIS — N1832 Chronic kidney disease, stage 3b: Secondary | ICD-10-CM | POA: Diagnosis not present

## 2022-10-10 DIAGNOSIS — J9600 Acute respiratory failure, unspecified whether with hypoxia or hypercapnia: Secondary | ICD-10-CM | POA: Diagnosis not present

## 2022-10-10 DIAGNOSIS — K2270 Barrett's esophagus without dysplasia: Secondary | ICD-10-CM | POA: Diagnosis not present

## 2022-10-18 DIAGNOSIS — E662 Morbid (severe) obesity with alveolar hypoventilation: Secondary | ICD-10-CM | POA: Diagnosis not present

## 2022-10-18 DIAGNOSIS — J9610 Chronic respiratory failure, unspecified whether with hypoxia or hypercapnia: Secondary | ICD-10-CM | POA: Diagnosis not present

## 2022-11-10 DIAGNOSIS — K2270 Barrett's esophagus without dysplasia: Secondary | ICD-10-CM | POA: Diagnosis not present

## 2022-11-10 DIAGNOSIS — J9600 Acute respiratory failure, unspecified whether with hypoxia or hypercapnia: Secondary | ICD-10-CM | POA: Diagnosis not present

## 2022-11-17 DIAGNOSIS — E662 Morbid (severe) obesity with alveolar hypoventilation: Secondary | ICD-10-CM | POA: Diagnosis not present

## 2022-11-17 DIAGNOSIS — J9610 Chronic respiratory failure, unspecified whether with hypoxia or hypercapnia: Secondary | ICD-10-CM | POA: Diagnosis not present

## 2022-11-24 DIAGNOSIS — E785 Hyperlipidemia, unspecified: Secondary | ICD-10-CM | POA: Diagnosis not present

## 2022-11-24 DIAGNOSIS — Z6837 Body mass index (BMI) 37.0-37.9, adult: Secondary | ICD-10-CM | POA: Diagnosis not present

## 2022-11-24 DIAGNOSIS — E039 Hypothyroidism, unspecified: Secondary | ICD-10-CM | POA: Diagnosis not present

## 2022-11-24 DIAGNOSIS — Z7189 Other specified counseling: Secondary | ICD-10-CM | POA: Diagnosis not present

## 2022-11-24 DIAGNOSIS — Z1331 Encounter for screening for depression: Secondary | ICD-10-CM | POA: Diagnosis not present

## 2022-11-24 DIAGNOSIS — I10 Essential (primary) hypertension: Secondary | ICD-10-CM | POA: Diagnosis not present

## 2022-11-24 DIAGNOSIS — Z79899 Other long term (current) drug therapy: Secondary | ICD-10-CM | POA: Diagnosis not present

## 2022-11-24 DIAGNOSIS — Z0000 Encounter for general adult medical examination without abnormal findings: Secondary | ICD-10-CM | POA: Diagnosis not present

## 2022-11-24 DIAGNOSIS — E1122 Type 2 diabetes mellitus with diabetic chronic kidney disease: Secondary | ICD-10-CM | POA: Diagnosis not present

## 2022-12-10 DIAGNOSIS — J9600 Acute respiratory failure, unspecified whether with hypoxia or hypercapnia: Secondary | ICD-10-CM | POA: Diagnosis not present

## 2022-12-10 DIAGNOSIS — K2270 Barrett's esophagus without dysplasia: Secondary | ICD-10-CM | POA: Diagnosis not present

## 2022-12-18 DIAGNOSIS — E662 Morbid (severe) obesity with alveolar hypoventilation: Secondary | ICD-10-CM | POA: Diagnosis not present

## 2022-12-18 DIAGNOSIS — J9610 Chronic respiratory failure, unspecified whether with hypoxia or hypercapnia: Secondary | ICD-10-CM | POA: Diagnosis not present

## 2022-12-24 ENCOUNTER — Ambulatory Visit: Payer: Medicare PPO | Admitting: Pulmonary Disease

## 2022-12-24 ENCOUNTER — Encounter: Payer: Self-pay | Admitting: Pulmonary Disease

## 2022-12-24 VITALS — BP 134/70 | HR 61 | Ht 66.5 in | Wt 233.0 lb

## 2022-12-24 DIAGNOSIS — R0602 Shortness of breath: Secondary | ICD-10-CM

## 2022-12-24 DIAGNOSIS — J9612 Chronic respiratory failure with hypercapnia: Secondary | ICD-10-CM | POA: Diagnosis not present

## 2022-12-24 DIAGNOSIS — G4733 Obstructive sleep apnea (adult) (pediatric): Secondary | ICD-10-CM

## 2022-12-24 NOTE — Progress Notes (Signed)
Amanda Bauer    [id number]    08-12-1945  Primary Care Physician:Prochnau, Rayfield Citizen, MD  Referring Physician: Philemon Kingdom, MD [address] [place],  [place] [address]  Chief complaint:    Follow-up for sleep apnea Chronic respiratory failure  HPI:  Has been doing well  No significant exacerbations Has not had any exacerbations or need for hospitalization  Continues to use a trilogy ventilator every night gets about 9 hours of sleep nightly Wakes up feeling like she is at a good nights rest Good energy levels Feels rejuvenated  Usually goes to bed between 10 and 11 Might take up to 2 hours to fall asleep sometimes About 3 awakenings Final wake up time between 10 AM and 11 AM  Weight has been stable  Recently lost her spouse  Admits to occasional dryness of the mouth in the morning No morning headaches No night sweats Memory is good Has no difficulty driving for long hours  Outpatient Encounter Medications as of 12/24/2022  Medication Sig   amLODipine (NORVASC) 5 MG tablet Take 1 tablet (5 mg total) by mouth daily. (Patient taking differently: Take 5 mg by mouth 2 (two) times daily.)   atorvastatin (LIPITOR) 10 MG tablet Take 10 mg by mouth daily.   carvedilol (COREG) 12.5 MG tablet Take 1 tablet (12.5 mg total) by mouth 2 (two) times daily. Patient needs an appointment for further refills. 2 nd attempt   cholecalciferol (VITAMIN D3) 25 MCG (1000 UNIT) tablet Take 1,000 Units by mouth daily.   FARXIGA 10 MG TABS tablet Take 10 mg by mouth daily.   furosemide (LASIX) 40 MG tablet Take 1 tablet (40 mg total) by mouth daily.   levothyroxine (SYNTHROID) 112 MCG tablet Take 112 mcg by mouth daily before breakfast.   loratadine (CLARITIN) 10 MG tablet Take 10 mg by mouth daily as needed for allergies.   valsartan (DIOVAN) 80 MG tablet Take 80 mg by mouth 2 (two) times daily.   No facility-administered encounter medications on file as of 12/24/2022.     Allergies as of 12/24/2022 - Review Complete 12/24/2022  Allergen Reaction Noted   Aspirin Other (See Comments) 02/20/2020   Tramadol Other (See Comments) 02/20/2020   Pravastatin Nausea Only 02/20/2020    Past Medical History:  Diagnosis Date   Anemia    Barrett esophagus    Chronic kidney disease    Stage 3   Colon polyps    Diverticula of colon    Left colon   Hemorrhoids    internal    Hiatal hernia    Hyperlipidemia    Hypertension    Hypothyroidism 02/20/2020   Osteoarthritis 02/20/2020   Sleep apnea    unconfirmed, undergoing workup    Past Surgical History:  Procedure Laterality Date   ABDOMINAL HYSTERECTOMY     complete hysterectomy      Family History  Problem Relation Age of Onset   Hypertension Mother    Stomach cancer Mother    Stroke Father    Cerebral aneurysm Other    Cancer Other        GI Tract    Social History   Socioeconomic History   Marital status: Married    Spouse name: Zeba Cerio   Number of children: 3   Years of education: Not on file   Highest education level: Not on file  Occupational History   Not on file  Tobacco Use   Smoking status: Former  Types: Cigarettes   Smokeless tobacco: Former    Types: Snuff    Quit date: 05/17/1989   Tobacco comments:    Family previously denied smoking, patient states quit many years ago  Vaping Use   Vaping status: Never Used  Substance and Sexual Activity   Alcohol use: Yes    Comment: occasional   Drug use: Never   Sexual activity: Not on file  Other Topics Concern   Not on file  Social History Narrative   Not on file   Social Determinants of Health   Financial Resource Strain: Not on file  Food Insecurity: Not on file  Transportation Needs: Not on file  Physical Activity: Not on file  Stress: Not on file  Social Connections: Not on file  Intimate Partner Violence: Not on file    Review of Systems  Constitutional: Negative.  Negative for fatigue.  Respiratory:   Positive for apnea. Negative for shortness of breath.   Psychiatric/Behavioral:  Positive for sleep disturbance.     There were no vitals filed for this visit.    Physical Exam Constitutional:      Appearance: She is obese.  HENT:     Head: Normocephalic.     Nose: Nose normal.  Eyes:     General:        Left eye: No discharge.     Pupils: Pupils are equal, round, and reactive to light.  Cardiovascular:     Rate and Rhythm: Normal rate and regular rhythm.     Pulses: Normal pulses.     Heart sounds: Normal heart sounds. No murmur heard.    No friction rub.  Pulmonary:     Effort: Pulmonary effort is normal. No respiratory distress.     Breath sounds: No stridor. No wheezing or rhonchi.  Musculoskeletal:     Cervical back: No rigidity or tenderness.  Neurological:     Mental Status: She is alert.  Psychiatric:        Mood and Affect: Mood normal.       02/26/2020    9:00 AM  Results of the Epworth flowsheet  Sitting and reading 0  Watching TV 1  Sitting, inactive in a public place (e.g. a theatre or a meeting) 0  As a passenger in a car for an hour without a break 0  Lying down to rest in the afternoon when circumstances permit 0  Sitting and talking to someone 0  Sitting quietly after a lunch without alcohol 1  In a car, while stopped for a few minutes in traffic 0  Total score 2    Sleep study Reviewed and shows moderate obstructive sleep apnea Titrated to a pressure of 13 with good resolution of events  She is on a trilogy ventilator PS SV 97% compliance residual AHI of 0.1  PFT shows no obstruction, no significant bronchodilator response, no restriction, mildly reduced diffusing capacity  Assessment:   Moderate obstructive sleep apnea Chronic respiratory failure -On a trilogy ventilator for chronic respiratory failure -Tolerating device well -Waking up in the morning feeling like she is at a good nights rest  Obesity -Encouraged to continue to  stay active -Regular exercises encouraged  Nonrestorative sleep -No significant daytime symptoms  Plan/Recommendations:  Continue regular use of trilogy ventilator at night  Use albuterol as needed  Call us with significant concerns  Follow-up in 6 months  Virl Diamond MD [facility] 12/24/2022, 1:52 PM  CC: Philemon Kingdom, MD

## 2022-12-24 NOTE — Patient Instructions (Signed)
Continue using your machine on a nightly basis  Is sure you get good enough hours of sleep Regular exercises as tolerated  Follow-up in 6 months  Call us with significant concerns  We do not need to make any changes at present as you are tolerating the device well and it is helping you stay healthy and stay out of the hospital

## 2022-12-29 DIAGNOSIS — H2513 Age-related nuclear cataract, bilateral: Secondary | ICD-10-CM | POA: Diagnosis not present

## 2022-12-29 DIAGNOSIS — H43813 Vitreous degeneration, bilateral: Secondary | ICD-10-CM | POA: Diagnosis not present

## 2023-01-10 DIAGNOSIS — K2270 Barrett's esophagus without dysplasia: Secondary | ICD-10-CM | POA: Diagnosis not present

## 2023-01-10 DIAGNOSIS — J9600 Acute respiratory failure, unspecified whether with hypoxia or hypercapnia: Secondary | ICD-10-CM | POA: Diagnosis not present

## 2023-01-18 DIAGNOSIS — J9610 Chronic respiratory failure, unspecified whether with hypoxia or hypercapnia: Secondary | ICD-10-CM | POA: Diagnosis not present

## 2023-01-18 DIAGNOSIS — E662 Morbid (severe) obesity with alveolar hypoventilation: Secondary | ICD-10-CM | POA: Diagnosis not present

## 2023-02-10 DIAGNOSIS — K2270 Barrett's esophagus without dysplasia: Secondary | ICD-10-CM | POA: Diagnosis not present

## 2023-02-10 DIAGNOSIS — J9600 Acute respiratory failure, unspecified whether with hypoxia or hypercapnia: Secondary | ICD-10-CM | POA: Diagnosis not present

## 2023-02-17 DIAGNOSIS — E662 Morbid (severe) obesity with alveolar hypoventilation: Secondary | ICD-10-CM | POA: Diagnosis not present

## 2023-02-17 DIAGNOSIS — J9610 Chronic respiratory failure, unspecified whether with hypoxia or hypercapnia: Secondary | ICD-10-CM | POA: Diagnosis not present

## 2023-03-12 DIAGNOSIS — K2270 Barrett's esophagus without dysplasia: Secondary | ICD-10-CM | POA: Diagnosis not present

## 2023-03-12 DIAGNOSIS — J9600 Acute respiratory failure, unspecified whether with hypoxia or hypercapnia: Secondary | ICD-10-CM | POA: Diagnosis not present

## 2023-03-20 DIAGNOSIS — E662 Morbid (severe) obesity with alveolar hypoventilation: Secondary | ICD-10-CM | POA: Diagnosis not present

## 2023-03-20 DIAGNOSIS — J9610 Chronic respiratory failure, unspecified whether with hypoxia or hypercapnia: Secondary | ICD-10-CM | POA: Diagnosis not present

## 2023-03-30 DIAGNOSIS — I10 Essential (primary) hypertension: Secondary | ICD-10-CM | POA: Diagnosis not present

## 2023-03-30 DIAGNOSIS — E1122 Type 2 diabetes mellitus with diabetic chronic kidney disease: Secondary | ICD-10-CM | POA: Diagnosis not present

## 2023-03-30 DIAGNOSIS — E039 Hypothyroidism, unspecified: Secondary | ICD-10-CM | POA: Diagnosis not present

## 2023-03-30 DIAGNOSIS — Z6837 Body mass index (BMI) 37.0-37.9, adult: Secondary | ICD-10-CM | POA: Diagnosis not present

## 2023-03-30 DIAGNOSIS — E785 Hyperlipidemia, unspecified: Secondary | ICD-10-CM | POA: Diagnosis not present

## 2023-03-30 DIAGNOSIS — Z79899 Other long term (current) drug therapy: Secondary | ICD-10-CM | POA: Diagnosis not present

## 2023-04-05 DIAGNOSIS — Z1231 Encounter for screening mammogram for malignant neoplasm of breast: Secondary | ICD-10-CM | POA: Diagnosis not present

## 2023-04-08 DIAGNOSIS — E669 Obesity, unspecified: Secondary | ICD-10-CM | POA: Diagnosis not present

## 2023-04-08 DIAGNOSIS — N1832 Chronic kidney disease, stage 3b: Secondary | ICD-10-CM | POA: Diagnosis not present

## 2023-04-08 DIAGNOSIS — E785 Hyperlipidemia, unspecified: Secondary | ICD-10-CM | POA: Diagnosis not present

## 2023-04-08 DIAGNOSIS — I5032 Chronic diastolic (congestive) heart failure: Secondary | ICD-10-CM | POA: Diagnosis not present

## 2023-04-08 DIAGNOSIS — I130 Hypertensive heart and chronic kidney disease with heart failure and stage 1 through stage 4 chronic kidney disease, or unspecified chronic kidney disease: Secondary | ICD-10-CM | POA: Diagnosis not present

## 2023-04-08 DIAGNOSIS — R7303 Prediabetes: Secondary | ICD-10-CM | POA: Diagnosis not present

## 2023-04-19 DIAGNOSIS — E662 Morbid (severe) obesity with alveolar hypoventilation: Secondary | ICD-10-CM | POA: Diagnosis not present

## 2023-04-19 DIAGNOSIS — J9610 Chronic respiratory failure, unspecified whether with hypoxia or hypercapnia: Secondary | ICD-10-CM | POA: Diagnosis not present

## 2023-04-22 DIAGNOSIS — Z1231 Encounter for screening mammogram for malignant neoplasm of breast: Secondary | ICD-10-CM | POA: Diagnosis not present

## 2023-05-20 DIAGNOSIS — E662 Morbid (severe) obesity with alveolar hypoventilation: Secondary | ICD-10-CM | POA: Diagnosis not present

## 2023-05-20 DIAGNOSIS — J9610 Chronic respiratory failure, unspecified whether with hypoxia or hypercapnia: Secondary | ICD-10-CM | POA: Diagnosis not present

## 2023-06-20 DIAGNOSIS — J9610 Chronic respiratory failure, unspecified whether with hypoxia or hypercapnia: Secondary | ICD-10-CM | POA: Diagnosis not present

## 2023-06-20 DIAGNOSIS — E662 Morbid (severe) obesity with alveolar hypoventilation: Secondary | ICD-10-CM | POA: Diagnosis not present

## 2023-07-18 DIAGNOSIS — E662 Morbid (severe) obesity with alveolar hypoventilation: Secondary | ICD-10-CM | POA: Diagnosis not present

## 2023-07-18 DIAGNOSIS — J9610 Chronic respiratory failure, unspecified whether with hypoxia or hypercapnia: Secondary | ICD-10-CM | POA: Diagnosis not present

## 2023-07-21 ENCOUNTER — Ambulatory Visit: Payer: Medicare PPO | Admitting: Pulmonary Disease

## 2023-08-18 DIAGNOSIS — E662 Morbid (severe) obesity with alveolar hypoventilation: Secondary | ICD-10-CM | POA: Diagnosis not present

## 2023-08-18 DIAGNOSIS — J9610 Chronic respiratory failure, unspecified whether with hypoxia or hypercapnia: Secondary | ICD-10-CM | POA: Diagnosis not present

## 2023-09-14 ENCOUNTER — Encounter: Payer: Self-pay | Admitting: Pulmonary Disease

## 2023-09-14 ENCOUNTER — Ambulatory Visit: Admitting: Pulmonary Disease

## 2023-09-14 VITALS — BP 144/76 | HR 52 | Temp 97.9°F | Ht 66.5 in | Wt 236.0 lb

## 2023-09-14 DIAGNOSIS — Z87891 Personal history of nicotine dependence: Secondary | ICD-10-CM | POA: Diagnosis not present

## 2023-09-14 DIAGNOSIS — J9612 Chronic respiratory failure with hypercapnia: Secondary | ICD-10-CM

## 2023-09-14 DIAGNOSIS — Z6837 Body mass index (BMI) 37.0-37.9, adult: Secondary | ICD-10-CM

## 2023-09-14 DIAGNOSIS — E66812 Obesity, class 2: Secondary | ICD-10-CM | POA: Diagnosis not present

## 2023-09-14 NOTE — Progress Notes (Signed)
 Amanda Bauer    [id number]    1945-07-23  Primary Care Physician:Prochnau, Deadra Everts, MD  Referring Physician: Olan Bering, MD [address] [place],  [place] [address]  Chief complaint:    Follow-up for sleep apnea Chronic respiratory failure  HPI:  Has been doing very well  No significant exacerbations  Continue to use trilogy ventilator every night Average use of 8 hours 32 minutes  Wakes up feeling like she is not a good nights rest Feels rejuvenated in the mornings  No significant changes in health  We did talk about need to stay very active  Admits to occasional dryness of the mouth in the morning No morning headaches No night sweats Memory is good Has no difficulty driving for long hours  Outpatient Encounter Medications as of 09/14/2023  Medication Sig   amLODipine  (NORVASC ) 5 MG tablet Take 1 tablet (5 mg total) by mouth daily. (Patient taking differently: Take 5 mg by mouth 2 (two) times daily.)   atorvastatin  (LIPITOR) 10 MG tablet Take 10 mg by mouth daily.   carvedilol  (COREG ) 12.5 MG tablet Take 1 tablet (12.5 mg total) by mouth 2 (two) times daily. Patient needs an appointment for further refills. 2 nd attempt   cholecalciferol (VITAMIN D3) 25 MCG (1000 UNIT) tablet Take 1,000 Units by mouth daily.   FARXIGA 10 MG TABS tablet Take 10 mg by mouth daily.   furosemide  (LASIX ) 40 MG tablet Take 1 tablet (40 mg total) by mouth daily.   levothyroxine  (SYNTHROID ) 112 MCG tablet Take 112 mcg by mouth daily before breakfast.   loratadine  (CLARITIN ) 10 MG tablet Take 10 mg by mouth daily as needed for allergies.   valsartan (DIOVAN) 80 MG tablet Take 80 mg by mouth 2 (two) times daily.   No facility-administered encounter medications on file as of 09/14/2023.    Allergies as of 09/14/2023 - Review Complete 09/14/2023  Allergen Reaction Noted   Aspirin Other (See Comments) 02/20/2020   Tramadol Other (See Comments) 02/20/2020   Pravastatin Nausea  Only 02/20/2020    Past Medical History:  Diagnosis Date   Anemia    Barrett esophagus    Chronic kidney disease    Stage 3   Colon polyps    Diverticula of colon    Left colon   Hemorrhoids    internal    Hiatal hernia    Hyperlipidemia    Hypertension    Hypothyroidism 02/20/2020   Osteoarthritis 02/20/2020   Sleep apnea    unconfirmed, undergoing workup    Past Surgical History:  Procedure Laterality Date   ABDOMINAL HYSTERECTOMY     complete hysterectomy      Family History  Problem Relation Age of Onset   Hypertension Mother    Stomach cancer Mother    Stroke Father    Cerebral aneurysm Other    Cancer Other        GI Tract    Social History   Socioeconomic History   Marital status: Married    Spouse name: Anaiis Widdowson   Number of children: 3   Years of education: Not on file   Highest education level: Not on file  Occupational History   Not on file  Tobacco Use   Smoking status: Former    Types: Cigarettes   Smokeless tobacco: Former    Types: Snuff    Quit date: 05/17/1989   Tobacco comments:    Family previously denied smoking, patient states  quit many years ago  Vaping Use   Vaping status: Never Used  Substance and Sexual Activity   Alcohol  use: Yes    Comment: occasional   Drug use: Never   Sexual activity: Not on file  Other Topics Concern   Not on file  Social History Narrative   Not on file   Social Drivers of Health   Financial Resource Strain: Not on file  Food Insecurity: Not on file  Transportation Needs: Not on file  Physical Activity: Not on file  Stress: Not on file  Social Connections: Not on file  Intimate Partner Violence: Not on file    Review of Systems  Constitutional: Negative.  Negative for fatigue.  Respiratory:  Positive for apnea. Negative for shortness of breath.   Psychiatric/Behavioral:  Positive for sleep disturbance.     Vitals:   09/14/23 0902  BP: (!) 144/76  Pulse: (!) 52  Temp: 97.9 F (36.6  C)  SpO2: 94%      Physical Exam Constitutional:      Appearance: She is obese.  HENT:     Head: Normocephalic.     Nose: Nose normal.  Eyes:     General:        Left eye: No discharge.     Pupils: Pupils are equal, round, and reactive to light.  Cardiovascular:     Rate and Rhythm: Normal rate and regular rhythm.     Pulses: Normal pulses.     Heart sounds: Normal heart sounds. No murmur heard.    No friction rub.  Pulmonary:     Effort: Pulmonary effort is normal. No respiratory distress.     Breath sounds: No stridor. No wheezing or rhonchi.  Musculoskeletal:     Cervical back: No rigidity or tenderness.  Neurological:     Mental Status: She is alert.  Psychiatric:        Mood and Affect: Mood normal.    Sleep study  Ventilator compliance reviewed showing excellent compliance with trilogy use Average use of 8 hours 32 minutes Pressure support of 8 Respiratory rate of 10 T min of 0.2 seconds, Tmax of 1.5 seconds, EPAP of 5, maximum pressure support of 15, set tidal volume of 350 mL AHI of 0.1  PFT shows no obstruction, no significant bronchodilator response, no restriction, mildly reduced diffusing capacity  Assessment:   Moderate obstructive sleep apnea Chronic respiratory failure - On trilogy ventilator - Tolerating well - We can open morning feeling like his had a good nights rest - Functioning well during the day  Class II obesity - Encouraged to exercise regularly    Plan/Recommendations:  Regular activities as tolerated  Continue trilogy ventilator at night  Call us  with significant concerns  Follow-up in 6 months   Myer Artis MD [facility] 09/14/2023, 9:07 AM  CC: Olan Bering, MD

## 2023-09-14 NOTE — Patient Instructions (Signed)
 Continue using your nocturnal ventilator at night  Continue regular activities as tolerated - Regular exercises will help  Use albuterol  as needed  Follow-up in 6 months  Call us  with any significant concerns

## 2023-09-17 DIAGNOSIS — E662 Morbid (severe) obesity with alveolar hypoventilation: Secondary | ICD-10-CM | POA: Diagnosis not present

## 2023-09-17 DIAGNOSIS — J9610 Chronic respiratory failure, unspecified whether with hypoxia or hypercapnia: Secondary | ICD-10-CM | POA: Diagnosis not present

## 2023-09-21 ENCOUNTER — Telehealth: Payer: Self-pay | Admitting: Pulmonary Disease

## 2023-09-21 NOTE — Telephone Encounter (Signed)
 Cmn received from [facility] for the continued use of ventilator.

## 2023-09-21 NOTE — Telephone Encounter (Signed)
 Dr. Deanna Expose signed. I will fax back w/office notes mentioning ventilator to [phone]

## 2023-10-18 DIAGNOSIS — J9610 Chronic respiratory failure, unspecified whether with hypoxia or hypercapnia: Secondary | ICD-10-CM | POA: Diagnosis not present

## 2023-10-18 DIAGNOSIS — E662 Morbid (severe) obesity with alveolar hypoventilation: Secondary | ICD-10-CM | POA: Diagnosis not present

## 2023-10-20 DIAGNOSIS — D631 Anemia in chronic kidney disease: Secondary | ICD-10-CM | POA: Diagnosis not present

## 2023-10-20 DIAGNOSIS — R809 Proteinuria, unspecified: Secondary | ICD-10-CM | POA: Diagnosis not present

## 2023-10-20 DIAGNOSIS — E785 Hyperlipidemia, unspecified: Secondary | ICD-10-CM | POA: Diagnosis not present

## 2023-10-20 DIAGNOSIS — N1832 Chronic kidney disease, stage 3b: Secondary | ICD-10-CM | POA: Diagnosis not present

## 2023-10-20 DIAGNOSIS — E669 Obesity, unspecified: Secondary | ICD-10-CM | POA: Diagnosis not present

## 2023-10-20 DIAGNOSIS — I5032 Chronic diastolic (congestive) heart failure: Secondary | ICD-10-CM | POA: Diagnosis not present

## 2023-10-20 DIAGNOSIS — I130 Hypertensive heart and chronic kidney disease with heart failure and stage 1 through stage 4 chronic kidney disease, or unspecified chronic kidney disease: Secondary | ICD-10-CM | POA: Diagnosis not present

## 2023-11-17 DIAGNOSIS — E662 Morbid (severe) obesity with alveolar hypoventilation: Secondary | ICD-10-CM | POA: Diagnosis not present

## 2023-11-17 DIAGNOSIS — J9610 Chronic respiratory failure, unspecified whether with hypoxia or hypercapnia: Secondary | ICD-10-CM | POA: Diagnosis not present

## 2023-12-18 DIAGNOSIS — J9610 Chronic respiratory failure, unspecified whether with hypoxia or hypercapnia: Secondary | ICD-10-CM | POA: Diagnosis not present

## 2023-12-18 DIAGNOSIS — E662 Morbid (severe) obesity with alveolar hypoventilation: Secondary | ICD-10-CM | POA: Diagnosis not present

## 2024-01-18 DIAGNOSIS — J9610 Chronic respiratory failure, unspecified whether with hypoxia or hypercapnia: Secondary | ICD-10-CM | POA: Diagnosis not present

## 2024-01-18 DIAGNOSIS — E662 Morbid (severe) obesity with alveolar hypoventilation: Secondary | ICD-10-CM | POA: Diagnosis not present

## 2024-01-19 DIAGNOSIS — I10 Essential (primary) hypertension: Secondary | ICD-10-CM | POA: Diagnosis not present

## 2024-01-19 DIAGNOSIS — E039 Hypothyroidism, unspecified: Secondary | ICD-10-CM | POA: Diagnosis not present

## 2024-01-19 DIAGNOSIS — R635 Abnormal weight gain: Secondary | ICD-10-CM | POA: Diagnosis not present

## 2024-01-19 DIAGNOSIS — E1122 Type 2 diabetes mellitus with diabetic chronic kidney disease: Secondary | ICD-10-CM | POA: Diagnosis not present

## 2024-01-19 DIAGNOSIS — E785 Hyperlipidemia, unspecified: Secondary | ICD-10-CM | POA: Diagnosis not present

## 2024-01-19 DIAGNOSIS — Z6838 Body mass index (BMI) 38.0-38.9, adult: Secondary | ICD-10-CM | POA: Diagnosis not present

## 2024-01-23 DIAGNOSIS — I10 Essential (primary) hypertension: Secondary | ICD-10-CM | POA: Diagnosis not present

## 2024-01-23 DIAGNOSIS — E1122 Type 2 diabetes mellitus with diabetic chronic kidney disease: Secondary | ICD-10-CM | POA: Diagnosis not present

## 2024-01-23 DIAGNOSIS — E039 Hypothyroidism, unspecified: Secondary | ICD-10-CM | POA: Diagnosis not present

## 2024-01-23 DIAGNOSIS — R5383 Other fatigue: Secondary | ICD-10-CM | POA: Diagnosis not present

## 2024-01-23 DIAGNOSIS — E785 Hyperlipidemia, unspecified: Secondary | ICD-10-CM | POA: Diagnosis not present

## 2024-01-23 DIAGNOSIS — R635 Abnormal weight gain: Secondary | ICD-10-CM | POA: Diagnosis not present

## 2024-02-17 DIAGNOSIS — J9610 Chronic respiratory failure, unspecified whether with hypoxia or hypercapnia: Secondary | ICD-10-CM | POA: Diagnosis not present

## 2024-02-17 DIAGNOSIS — E662 Morbid (severe) obesity with alveolar hypoventilation: Secondary | ICD-10-CM | POA: Diagnosis not present

## 2024-02-29 DIAGNOSIS — I130 Hypertensive heart and chronic kidney disease with heart failure and stage 1 through stage 4 chronic kidney disease, or unspecified chronic kidney disease: Secondary | ICD-10-CM | POA: Diagnosis not present

## 2024-02-29 DIAGNOSIS — I5032 Chronic diastolic (congestive) heart failure: Secondary | ICD-10-CM | POA: Diagnosis not present

## 2024-02-29 DIAGNOSIS — E785 Hyperlipidemia, unspecified: Secondary | ICD-10-CM | POA: Diagnosis not present

## 2024-02-29 DIAGNOSIS — E669 Obesity, unspecified: Secondary | ICD-10-CM | POA: Diagnosis not present

## 2024-02-29 DIAGNOSIS — R809 Proteinuria, unspecified: Secondary | ICD-10-CM | POA: Diagnosis not present

## 2024-02-29 DIAGNOSIS — N1832 Chronic kidney disease, stage 3b: Secondary | ICD-10-CM | POA: Diagnosis not present

## 2024-02-29 DIAGNOSIS — D631 Anemia in chronic kidney disease: Secondary | ICD-10-CM | POA: Diagnosis not present

## 2024-03-19 DIAGNOSIS — E662 Morbid (severe) obesity with alveolar hypoventilation: Secondary | ICD-10-CM | POA: Diagnosis not present

## 2024-03-19 DIAGNOSIS — J9610 Chronic respiratory failure, unspecified whether with hypoxia or hypercapnia: Secondary | ICD-10-CM | POA: Diagnosis not present

## 2024-04-18 DIAGNOSIS — E662 Morbid (severe) obesity with alveolar hypoventilation: Secondary | ICD-10-CM | POA: Diagnosis not present

## 2024-04-18 DIAGNOSIS — J9610 Chronic respiratory failure, unspecified whether with hypoxia or hypercapnia: Secondary | ICD-10-CM | POA: Diagnosis not present

## 2024-06-06 ENCOUNTER — Ambulatory Visit: Admitting: Pulmonary Disease

## 2024-09-12 ENCOUNTER — Ambulatory Visit: Admitting: Pulmonary Disease
